# Patient Record
Sex: Male | Born: 1975 | Race: White | Hispanic: No | Marital: Married | Smoking: Current some day smoker
Health system: Southern US, Community
[De-identification: ages and names within clinical notes are randomized; demographics above are authoritative.]

## PROBLEM LIST (undated history)

## (undated) ENCOUNTER — Ambulatory Visit: Admission: EM | Payer: 59

## (undated) DIAGNOSIS — E785 Hyperlipidemia, unspecified: Secondary | ICD-10-CM

## (undated) DIAGNOSIS — Z720 Tobacco use: Secondary | ICD-10-CM

## (undated) DIAGNOSIS — E669 Obesity, unspecified: Secondary | ICD-10-CM

## (undated) DIAGNOSIS — K529 Noninfective gastroenteritis and colitis, unspecified: Secondary | ICD-10-CM

## (undated) DIAGNOSIS — D649 Anemia, unspecified: Secondary | ICD-10-CM

## (undated) DIAGNOSIS — F419 Anxiety disorder, unspecified: Secondary | ICD-10-CM

## (undated) HISTORY — DX: Hyperlipidemia, unspecified: E78.5

## (undated) HISTORY — DX: Tobacco use: Z72.0

## (undated) HISTORY — DX: Noninfective gastroenteritis and colitis, unspecified: K52.9

## (undated) HISTORY — DX: Anemia, unspecified: D64.9

## (undated) HISTORY — DX: Obesity, unspecified: E66.9

## (undated) HISTORY — PX: TONSILLECTOMY: SHX5217

## (undated) HISTORY — DX: Anxiety disorder, unspecified: F41.9

---

## 2007-02-06 ENCOUNTER — Ambulatory Visit: Payer: Self-pay | Admitting: Gastroenterology

## 2007-08-16 DIAGNOSIS — R197 Diarrhea, unspecified: Secondary | ICD-10-CM

## 2007-08-16 DIAGNOSIS — F17200 Nicotine dependence, unspecified, uncomplicated: Secondary | ICD-10-CM

## 2008-05-05 ENCOUNTER — Ambulatory Visit: Payer: Self-pay | Admitting: *Deleted

## 2008-05-05 DIAGNOSIS — F411 Generalized anxiety disorder: Secondary | ICD-10-CM | POA: Insufficient documentation

## 2008-05-05 DIAGNOSIS — E669 Obesity, unspecified: Secondary | ICD-10-CM | POA: Insufficient documentation

## 2008-05-05 DIAGNOSIS — E785 Hyperlipidemia, unspecified: Secondary | ICD-10-CM

## 2008-05-05 DIAGNOSIS — D485 Neoplasm of uncertain behavior of skin: Secondary | ICD-10-CM

## 2008-05-05 LAB — CONVERTED CEMR LAB
BUN: 17 mg/dL
Basophils Absolute: 0 10*3/uL
Basophils Relative: 0 %
CO2: 25 meq/L
Calcium: 9.5 mg/dL
Chloride: 108 meq/L
Cholesterol: 199 mg/dL
Creatinine, Ser: 0.8 mg/dL
Eosinophils Absolute: 0.1 10*3/uL
Eosinophils Relative: 1.2 %
GFR calc Af Amer: 144 mL/min
GFR calc non Af Amer: 119 mL/min
Glucose, Bld: 93 mg/dL
HCT: 49.3 %
HDL: 28.7 mg/dL — ABNORMAL LOW
Hemoglobin: 17 g/dL
LDL Cholesterol: 157 mg/dL — ABNORMAL HIGH
Lymphocytes Relative: 42.4 %
MCHC: 34.5 g/dL
MCV: 87.8 fL
Monocytes Absolute: 0.4 10*3/uL
Monocytes Relative: 6.4 %
Neutro Abs: 2.7 10*3/uL
Neutrophils Relative %: 50 %
Platelets: 225 10*3/uL
Potassium: 4.6 meq/L
RBC: 5.61 M/uL
RDW: 12 %
Sodium: 140 meq/L
TSH: 1.22 u[IU]/mL
Total CHOL/HDL Ratio: 6.9
Triglycerides: 65 mg/dL
VLDL: 13 mg/dL
WBC: 5.6 10*3/uL

## 2008-06-05 ENCOUNTER — Ambulatory Visit: Payer: Self-pay | Admitting: *Deleted

## 2008-07-08 ENCOUNTER — Ambulatory Visit: Payer: Self-pay | Admitting: *Deleted

## 2008-07-08 DIAGNOSIS — D509 Iron deficiency anemia, unspecified: Secondary | ICD-10-CM

## 2008-12-19 ENCOUNTER — Telehealth: Payer: Self-pay | Admitting: Family Medicine

## 2009-01-15 ENCOUNTER — Ambulatory Visit: Payer: Self-pay | Admitting: Internal Medicine

## 2009-01-19 ENCOUNTER — Ambulatory Visit: Payer: Self-pay | Admitting: Internal Medicine

## 2009-01-19 LAB — CONVERTED CEMR LAB
ALT: 32 units/L (ref 0–53)
AST: 22 units/L (ref 0–37)
Alkaline Phosphatase: 97 units/L (ref 39–117)
Cholesterol: 196 mg/dL (ref 0–200)
Total Bilirubin: 0.5 mg/dL (ref 0.3–1.2)
Total Protein: 6.6 g/dL (ref 6.0–8.3)

## 2009-01-30 ENCOUNTER — Ambulatory Visit (HOSPITAL_BASED_OUTPATIENT_CLINIC_OR_DEPARTMENT_OTHER): Admission: RE | Admit: 2009-01-30 | Discharge: 2009-01-30 | Payer: Self-pay | Admitting: Internal Medicine

## 2009-01-30 ENCOUNTER — Ambulatory Visit: Payer: Self-pay | Admitting: Diagnostic Radiology

## 2009-01-30 ENCOUNTER — Ambulatory Visit: Payer: Self-pay | Admitting: Internal Medicine

## 2009-01-30 ENCOUNTER — Telehealth: Payer: Self-pay | Admitting: Internal Medicine

## 2009-01-30 DIAGNOSIS — R109 Unspecified abdominal pain: Secondary | ICD-10-CM | POA: Insufficient documentation

## 2009-01-30 LAB — CONVERTED CEMR LAB
Basophils Relative: 1 % (ref 0–1)
CO2: 24 meq/L (ref 19–32)
Calcium: 9.7 mg/dL (ref 8.4–10.5)
Creatinine, Ser: 0.87 mg/dL (ref 0.40–1.50)
Eosinophils Absolute: 0.1 10*3/uL (ref 0.0–0.7)
Eosinophils Relative: 1 % (ref 0–5)
Glucose, Bld: 105 mg/dL — ABNORMAL HIGH (ref 70–99)
Lymphocytes Relative: 35 % (ref 12–46)
Lymphs Abs: 2.9 10*3/uL (ref 0.7–4.0)
Monocytes Absolute: 0.9 10*3/uL (ref 0.1–1.0)
Neutrophils Relative %: 53 % (ref 43–77)
Platelets: 247 10*3/uL (ref 150–400)
RDW: 12.5 % (ref 11.5–15.5)
WBC: 8.3 10*3/uL (ref 4.0–10.5)

## 2009-02-24 ENCOUNTER — Telehealth: Payer: Self-pay | Admitting: Internal Medicine

## 2009-03-31 ENCOUNTER — Ambulatory Visit: Payer: Self-pay | Admitting: Internal Medicine

## 2009-07-05 ENCOUNTER — Encounter: Payer: Self-pay | Admitting: Family Medicine

## 2009-07-05 ENCOUNTER — Ambulatory Visit: Payer: Self-pay | Admitting: Family Medicine

## 2009-07-05 DIAGNOSIS — M545 Low back pain: Secondary | ICD-10-CM

## 2010-03-11 ENCOUNTER — Encounter: Payer: Self-pay | Admitting: Internal Medicine

## 2010-03-11 ENCOUNTER — Ambulatory Visit: Payer: Self-pay | Admitting: Internal Medicine

## 2010-03-11 DIAGNOSIS — G4700 Insomnia, unspecified: Secondary | ICD-10-CM

## 2010-03-11 DIAGNOSIS — R002 Palpitations: Secondary | ICD-10-CM

## 2010-03-11 LAB — CONVERTED CEMR LAB
CO2: 24 meq/L (ref 19–32)
Chloride: 103 meq/L (ref 96–112)
Creatinine, Ser: 0.84 mg/dL (ref 0.40–1.50)
Free T4: 1.42 ng/dL (ref 0.80–1.80)
HCT: 49.3 % (ref 39.0–52.0)
Platelets: 241 10*3/uL (ref 150–400)
Potassium: 4.6 meq/L (ref 3.5–5.3)
WBC: 9.7 10*3/uL (ref 4.0–10.5)

## 2010-03-12 ENCOUNTER — Encounter: Payer: Self-pay | Admitting: Internal Medicine

## 2010-03-23 ENCOUNTER — Ambulatory Visit: Payer: Self-pay | Admitting: Internal Medicine

## 2010-05-10 ENCOUNTER — Encounter: Payer: Self-pay | Admitting: Internal Medicine

## 2010-05-10 LAB — CONVERTED CEMR LAB
ALT: 24 units/L (ref 0–53)
AST: 17 units/L (ref 0–37)
Albumin: 5.1 g/dL (ref 3.5–5.2)
Alkaline Phosphatase: 104 units/L (ref 39–117)
CRP, High Sensitivity: 2.2
Cholesterol: 230 mg/dL — ABNORMAL HIGH (ref 0–200)
HDL: 38 mg/dL — ABNORMAL LOW (ref >39)
Total CHOL/HDL Ratio: 6.1
Total Protein: 7.5 g/dL (ref 6.0–8.3)
Triglycerides: 81 mg/dL (ref <150)

## 2010-05-12 ENCOUNTER — Ambulatory Visit: Payer: Self-pay | Admitting: Internal Medicine

## 2010-06-29 NOTE — Letter (Signed)
   [facility] [address] [place], [place]  [address]  [place] Phone: [phone]      March 12, 2010   [facility] [address] [facility] [address] [place], [place] [address]  RE:  LAB RESULTS  Dear  Joseph Calderon,  The following is an interpretation of your most recent lab tests.  Please take note of any instructions provided or changes to medications that have resulted from your lab work.  ELECTROLYTES:  Good - no changes needed  KIDNEY FUNCTION TESTS:  Good - no changes needed   THYROID STUDIES:  Thyroid studies normal TSH: 2.534     CBC:  Good - no changes needed       Sincerely Yours,    Dr. Thomos Lemons  Appended Document:  Mailed.

## 2010-06-29 NOTE — Assessment & Plan Note (Signed)
Summary: LBP x 4dys rm 3   Vital Signs:  Patient Profile:   35 Years Old Male CC:      LBP- coughing x 4 dys Height:     71 inches Weight:      218 pounds O2 Sat:      100 % O2 treatment:    Room Air Temp:     97.4 degrees F oral Pulse rate:   87 / minute Pulse rhythm:   regular Resp:     16 per minute BP sitting:   139 / 85  (right arm) Cuff size:   regular  Vitals Entered By: Areta Haber CMA (July 05, 2009 1:14 PM)                  Current Allergies: No known allergies History of Present Illness Chief Complaint: LBP- coughing x 4 dys History of Present Illness: Subjective:  Patient complains of intermittent non-radiating low back pain for about 4 days, worse when sitting and bending over.  No recent trauma or change in activities.  He has had a cold for about 1.5 weeks with cough that is almost resolved.  The pain is improved with ibuprofen.  No bowel or bladder dysfunction.  No recent weight loss/gain.  No fevers, chills, and sweats.  No saddle numbness.  He had an episode of back pain when he was a teenager that lasted about 8 weeks.  Current Problems: LOW BACK PAIN, MILD (ICD-724.2) INGUINAL PAIN, LEFT (ICD-789.09) ANEMIA-IRON DEFICIENCY (ICD-280.9) ANXIETY (ICD-300.00) WELL ADULT (ICD-V70.0) OBESITY (ICD-278.00) NEOPLASM OF UNCERTAIN BEHAVIOR OF SKIN (ICD-238.2) HYPERLIPIDEMIA (ICD-272.4) DIARRHEA, CHRONIC (ICD-787.91) TOBACCO USER (ICD-305.1)   Current Meds PAROXETINE HCL 20 MG TABS (PAROXETINE HCL) Take 1 tablet by mouth once a day IMODIUM ADVANCED 2-125 MG TABS (LOPERAMIDE-SIMETHICONE) one tab by mouth once daily prn EQL FISH OIL 1000 MG CAPS (OMEGA-3 FATTY ACIDS) one cap by mouth once daily MULTIVITAMINS  TABS (MULTIPLE VITAMIN) one tab by mouth once daily ADVIL 200 MG TABS (IBUPROFEN) As directed CYCLOBENZAPRINE HCL 10 MG TABS (CYCLOBENZAPRINE HCL) One tab by mouth, once or twice daily as needed NAPROXEN 500 MG TABS (NAPROXEN) One by mouth two  times a day pc  REVIEW OF SYSTEMS Constitutional Symptoms      Denies fever, chills, night sweats, weight loss, weight gain, and fatigue.  Eyes       Denies change in vision, eye pain, eye discharge, glasses, contact lenses, and eye surgery. Ear/Nose/Throat/Mouth       Denies hearing loss/aids, change in hearing, ear pain, ear discharge, dizziness, frequent runny nose, frequent nose bleeds, sinus problems, sore throat, hoarseness, and tooth pain or bleeding.  Respiratory       Denies dry cough, productive cough, wheezing, shortness of breath, asthma, bronchitis, and emphysema/COPD.  Cardiovascular       Denies murmurs, chest pain, and tires easily with exhertion.    Gastrointestinal       Denies stomach pain, nausea/vomiting, diarrhea, constipation, blood in bowel movements, and indigestion. Genitourniary       Denies painful urination, kidney stones, and loss of urinary control. Neurological       Denies paralysis, seizures, and fainting/blackouts. Musculoskeletal       Complains of decreased range of motion.      Denies muscle pain, joint pain, joint stiffness, redness, swelling, muscle weakness, and gout.      Comments: LBP x 4 dys Skin       Denies bruising, unusual mles/lumps or sores, and  hair/skin or nail changes.  Psych       Denies mood changes, temper/anger issues, anxiety/stress, speech problems, depression, and sleep problems. Other Comments: Pt states that he feels his back pain maybe from coughing. Pt has not seen PCP for this.   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  Extraocular movement is intact.  Conjunctivae are not inflamed.  Pharynx:  Normal  Neck:  Supple.  No adenopathy is present.  No thyromegaly is present  Lungs:  Clear to auscultation.  Breath sounds are equal.  Heart:  Regular rate and rhythm without murmurs, rubs, or gallops.  Abdomen:  Nontender without masses or hepatosplenomegaly.  Bowel sounds are present.  No CVA or flank tenderness.   Back:  Full range of motion.  Can heel/toe walk and squat without difficulty.   Minimal tenderness in the right paraspinous muscles at L3 and L4.  Straight leg raising test is negative.  Sitting knee extension test is negative.  Strength and sensation in the lower extremities is normal.  Patellar and achilles reflexes are normal.  LS spine X-ray:  negative Assessment New Problems: LOW BACK PAIN, MILD (ICD-724.2)   Plan New Medications/Changes: NAPROXEN 500 MG TABS (NAPROXEN) One by mouth two times a day pc  #14 x 0, 07/05/2009, Donna Christen MD CYCLOBENZAPRINE HCL 10 MG TABS (CYCLOBENZAPRINE HCL) One tab by mouth, once or twice daily as needed  #14 x 0, 07/05/2009, Donna Christen MD  New Orders: T-Lumbar Spine Complete, 5 Views [71110TC] New Patient Level III [98119] Planning Comments:   NSAID for about a week.  Flexeril once or twice daily as needed.  Begin ice pack once or twice daily.  Begin back exercises ([employer] information and instruction patient handout given).  Urged to discontinue smoking. Follow-up with PCP if not improved 2 weeks, or if symptoms worsent.   The patient and/or caregiver has been  counseled thoroughly with regard to medications prescribed including dosage, schedule, interactions, rationale for use, and possible side effects and they verbalize understanding.  Diagnoses and expected course of recovery discussed and will return if not improved as expected or if the condition worsens. Patient and/or caregiver verbalized understanding.  Prescriptions: NAPROXEN 500 MG TABS (NAPROXEN) One by mouth two times a day pc  #14 x 0   Entered and Authorized by:   Donna Christen MD   Signed by:   Donna Christen MD on 07/05/2009   Method used:   Print then Give to Patient   RxID:   [id number] CYCLOBENZAPRINE HCL 10 MG TABS (CYCLOBENZAPRINE HCL) One tab by mouth, once or twice daily as needed  #14 x 0   Entered and Authorized by:   Donna Christen MD   Signed by:   Donna Christen MD on 07/05/2009   Method used:   Print  then Give to Patient   RxID:   [id number]

## 2010-06-29 NOTE — Assessment & Plan Note (Signed)
Summary: general questions/dt   Vital Signs:  Patient profile:   35 year old male Height:      71 inches Weight:      203 pounds BMI:     28.42 O2 Sat:      99 % on Room air Temp:     97.8 degrees F oral Pulse rate:   73 / minute Pulse rhythm:   regular Resp:     18 per minute BP sitting:   110 / 70  (left arm) Cuff size:   large  Vitals Entered By: Glendell Docker CMA (March 11, 2010 11:03 AM)  O2 Flow:  Room air CC: Trouble sleeping & heart racing Is Patient Diabetic? No Pain Assessment Patient in pain? no        Primary Care Provider:  Dondra Spry DO  CC:  Trouble sleeping & heart racing.  History of Present Illness: 35 y/o with hx of anxiety c/o chronic insomnia worse over last 2-3 months  recently had wisdom tooth pulled  no unusual stress at work and at home also co intermittent palpitations - pulse was 94.   2 x over last one month  no chest pain or shortness of breath  previously drinking red bull occ diet coke or mountain dew   Current Diet: Breakfast: bagel, or pop tart Lunch: peanut sandwich DInner: meat, vegetable and starch,  occ fast food Snacks: Beverage:water and juices    Preventive Screening-Counseling & Management  Alcohol-Tobacco     Smoking Status: current  Allergies (verified): No Known Drug Allergies  Past History:  Past Medical History: DIARRHEA, CHRONIC - intermittent obesity TOBACCO USER (ICD-305.1)   Hyperlipidemia  anxiety  Anemia-iron deficiency  Past Surgical History: Tonsillectomy       Family History: Family History Hypertension CAD - father        Social History: 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    Physical Exam  General:  alert, well-developed, and well-nourished.   Head:  normocephalic and atraumatic.   Mouth:  pharynx pink and moist.   Neck:  No deformities,  masses, or tenderness noted.no neck tenderness.   Lungs:  normal respiratory effort, normal breath sounds, and no wheezes.   Heart:  normal rate, regular rhythm, no murmur, and no gallop.   Extremities:  No lower extremity edema  Neurologic:  cranial nerves II-XII intact and gait normal.   Psych:  normally interactive, good eye contact, not anxious appearing, and not depressed appearing.     Impression & Recommendations:  Problem # 1:  PALPITATIONS (ICD-785.1) 35 y/o with intermittent palpitations.  likely benign. EKG normal  Orders: EKG w/ Interpretation (93000) T-Basic Metabolic Panel (59563-87564) T-CBC No Diff (33295-18841) T-T4, Free (66063-01601) T-TSH [phone]) T-Magnesium [phone])  Avoid caffeine, chocolate, and stimulants. Stress reduction as well as medication options discussed.   Problem # 2:  INSOMNIA (ICD-780.52) Assessment: Deteriorated I would like to avoid use of prescription sleep aids reviewed practicing good sleep hygiene try OTC melatononin  Problem # 3:  OBESITY (ICD-278.00) Assessment: Improved  Problem # 4:  HYPERLIPIDEMIA (ICD-272.4) arrange labs before next OV Labs Reviewed: SGOT: 22 (01/19/2009)   SGPT: 32 (01/19/2009)   HDL:35 (01/19/2009), 28.7 (05/05/2008)  LDL:142 (01/19/2009), 157 (05/05/2008)  Chol:196 (01/19/2009), 199 (05/05/2008)  Trig:97 (01/19/2009), 65 (05/05/2008)  Complete Medication List: 1)  Multivitamins Tabs (Multiple vitamin) .... One tab  by mouth once daily 2)  Advil 200 Mg Tabs (Ibuprofen) .... As directed 3)  Hydrocodone-acetaminophen 5-500 Mg Tabs (Hydrocodone-acetaminophen) .... One tablet every 4-6 hours as needed pain  Other Orders: Flu Vaccine 6yrs + (14782) Admin 1st Vaccine (95621)  Patient Instructions: 1)  Practice good sleep hygiene as discussed 2)  Try over the counter melatonin 3)  Please schedule a follow-up appointment in 2 months. 4)  Decrease intake of sweets / refined sugar 5)  Avoid  caffeine after 12 PM 6)  Hepatic Panel prior to visit, ICD-9: 272.4 7)  Lipid Panel prior to visit, ICD-9: 272.4 8)  High sensitivity CRP :  272.4  Current Allergies (reviewed today): No known allergies    Immunizations Administered:  Influenza Vaccine # 1:    Vaccine Type: Fluvax 3+    Site: left deltoid    Mfr: GlaxoSmithKline    Dose: 0.5 ml    Route: IM    Given by: Glendell Docker CMA    Exp. Date: 11/27/2010    Lot #: [id number]    VIS given: 12/22/09 version given March 11, 2010.  Flu Vaccine Consent Questions:    Do you have a history of severe allergic reactions to this vaccine? no    Any prior history of allergic reactions to egg and/or gelatin? no    Do you have a sensitivity to the preservative Thimersol? no    Do you have a past history of Guillan-Barre Syndrome? no    Do you currently have an acute febrile illness? no    Have you ever had a severe reaction to latex? no    Vaccine information given and explained to patient? yes

## 2010-07-01 NOTE — Miscellaneous (Signed)
Summary: Orders Update  Clinical Lists Changes  Orders: Added new Test order of T-Hepatic Function [phone]) - Signed Added new Test order of T-Lipid Profile [phone]) - Signed Added new Test order of CRP, high sensitivity-[facility] ([id number]) - Signed

## 2010-07-01 NOTE — Assessment & Plan Note (Signed)
Summary: 2 month follow up/mhf   Vital Signs:  Patient profile:   35 year old male Height:      71 inches Weight:      211 pounds BMI:     29.53 O2 Sat:      99 % on Room air Temp:     97.9 degrees F oral Pulse rate:   77 / minute Resp:     20 per minute BP sitting:   102 / 80  (right arm) Cuff size:   large  Vitals Entered By: Glendell Docker CMA (May 12, 2010 8:13 AM)  O2 Flow:  Room air CC: 2 Month Follow up  Is Patient Diabetic? No Pain Assessment Patient in pain? no        Primary Care Provider:  Dondra Spry DO  CC:  2 Month Follow up .  History of Present Illness:  Hyperlipidemia Follow-Up      This is a 35 year old man who presents for Hyperlipidemia follow-up.  The patient denies muscle aches.  The patient denies the following symptoms: chest pain/pressure and exercise intolerance.  Dietary compliance has been fair.  LDL elevated despite dietary changes  Preventive Screening-Counseling & Management  Alcohol-Tobacco     Smoking Status: current  Allergies (verified): No Known Drug Allergies  Past History:  Past Medical History: DIARRHEA, CHRONIC - intermittent obesity TOBACCO USER (ICD-305.1)   Hyperlipidemia  anxiety  Anemia-iron deficiency   Family History: Family History Hypertension CAD - father    (MI at age 77)  CABG   Physical Exam  General:  alert, well-developed, and well-nourished.   Neck:  no carotid bruits.   Lungs:  normal respiratory effort, normal breath sounds, and no wheezes.   Heart:  normal rate, regular rhythm, and no gallop.   Extremities:  No lower extremity edema    Impression & Recommendations:  Problem # 1:  HYPERLIPIDEMIA (ICD-272.4) strong fam hx of CAD start statin His updated medication list for this problem includes:    Lipitor 20 Mg Tabs (Atorvastatin calcium) ..... One by mouth once daily  Labs Reviewed: SGOT: 17 (05/10/2010)   SGPT: 24 (05/10/2010)   HDL:38 (05/10/2010), 35 (01/19/2009)   LDL:176 (05/10/2010), 142 (01/19/2009)  Chol:230 (05/10/2010), 196 (01/19/2009)  Trig:81 (05/10/2010), 97 (01/19/2009)  Complete Medication List: 1)  Multivitamins Tabs (Multiple vitamin) .... One tab by mouth once daily 2)  Lipitor 20 Mg Tabs (Atorvastatin calcium) .... One by mouth once daily  Patient Instructions: 1)  Please schedule a follow-up appointment in 4 months. 2)  Hepatic Panel prior to visit, ICD-9:  272.4 3)  Lipid Panel prior to visit, ICD-9: 272.4 4)  High sensitivity CRP :  272.4 5)  Please return for lab work one (1) week before your next appointment.  Prescriptions: LIPITOR 20 MG TABS (ATORVASTATIN CALCIUM) one by mouth once daily  #30 x 5   Entered and Authorized by:   D. Thomos Lemons DO   Signed by:   D. Thomos Lemons DO on 05/12/2010   Method used:   Electronically to        [facility] # [phone]* (retail)       [address] [place], [place]  [address]       Ph: [phone]       Fax: [phone]   RxID:   [id number]    Orders Added: 1)  Est. Patient  Level III [16109]    Current Allergies (reviewed today): No known allergies

## 2010-08-31 ENCOUNTER — Ambulatory Visit: Payer: Self-pay | Admitting: Internal Medicine

## 2010-09-23 ENCOUNTER — Other Ambulatory Visit: Payer: Self-pay | Admitting: Internal Medicine

## 2010-09-23 DIAGNOSIS — E785 Hyperlipidemia, unspecified: Secondary | ICD-10-CM

## 2010-09-23 LAB — LIPID PANEL
HDL: 37 mg/dL — ABNORMAL LOW (ref >39)
Triglycerides: 89 mg/dL (ref <150)

## 2010-09-24 LAB — HEPATIC FUNCTION PANEL
ALT: 33 U/L (ref 0–53)
Albumin: 5 g/dL (ref 3.5–5.2)
Total Protein: 6.9 g/dL (ref 6.0–8.3)

## 2010-09-27 ENCOUNTER — Ambulatory Visit (INDEPENDENT_AMBULATORY_CARE_PROVIDER_SITE_OTHER): Payer: BC Managed Care – PPO | Admitting: Internal Medicine

## 2010-09-27 ENCOUNTER — Encounter: Payer: Self-pay | Admitting: Internal Medicine

## 2010-09-27 DIAGNOSIS — J329 Chronic sinusitis, unspecified: Secondary | ICD-10-CM

## 2010-09-27 DIAGNOSIS — R748 Abnormal levels of other serum enzymes: Secondary | ICD-10-CM | POA: Insufficient documentation

## 2010-09-27 MED ORDER — AMOXICILLIN 875 MG PO TABS: 875.0000 mg | ORAL_TABLET | Freq: Two times a day (BID) | ORAL | Status: AC

## 2010-09-27 NOTE — Patient Instructions (Addendum)
Our office will contact you re:  Liver blood test results Please complete the following lab tests before your next follow up appointment: LFT's - 790.5 CBCD - 790.5

## 2010-09-27 NOTE — Progress Notes (Signed)
  Subjective:    Patient ID: Joseph Calderon, male    DOB: March 16, 1976, 35 y.o.   MRN: [id number]  Sinusitis This is a new problem. The current episode started in the past 7 days. The problem is unchanged. There has been no fever. Associated symptoms include congestion, headaches and sinus pressure.   Hyperlipidemia - lab results reviewed.  Pt has elevated alk phos.  He denies abd complaints.   Review of Systems  HENT: Positive for congestion and sinus pressure.   Neurological: Positive for headaches.    He quit smoking 3 weeks ago.  Rare loose stools,  No abd pain     Past Medical History  Diagnosis Date  . Anxiety   . Anemia     iron deficient  . Chronic diarrhea   . Obesity   . Tobacco user   . Hyperlipemia     History   Social History  . Marital Status: Married    Spouse Name: N/A    Number of Children: 1  . Years of Education: N/A   Occupational History  . ASSISTANT STORE MANA    Social History Main Topics  . Smoking status: Former Games developer  . Smokeless tobacco: Not on file  . Alcohol Use: Not on file  . Drug Use: Not on file  . Sexually Active: Not on file   Other Topics Concern  . Not on file   Social History Narrative  . No narrative on file    Past Surgical History  Procedure Date  . Tonsillectomy     Family History  Problem Relation Age of Onset  . Heart disease Father 7     MI, CAD, CABG    No Known Allergies  Current Outpatient Prescriptions on File Prior to Visit  Medication Sig Dispense Refill  . atorvastatin (LIPITOR) 20 MG tablet Take 20 mg by mouth daily.        . Multiple Vitamins-Minerals (MULTIVITAMIN WITH MINERALS) tablet Take 1 tablet by mouth daily.          BP 112/68  Pulse 72  Temp(Src) 97.4 F (36.3 C) (Oral)  Resp 18  Ht 5\' 11"  (1.803 m)  Wt 220 lb (99.791 kg)  BMI 30.68 kg/m2  SpO2 98%    Objective:   Physical Exam  Constitutional: He appears well-developed.  HENT:  Head: Normocephalic and atraumatic.    Mouth/Throat: Oropharynx is clear and moist.       Right and left TM retracted  Neck: Normal range of motion. Neck supple.  Cardiovascular: Normal rate, regular rhythm and normal heart sounds.   Pulmonary/Chest: Effort normal and breath sounds normal. He has no wheezes. He has no rales.  Skin: Skin is warm and dry.    Assessment & Plan:

## 2010-09-28 ENCOUNTER — Telehealth: Payer: Self-pay | Admitting: Internal Medicine

## 2010-09-28 LAB — VITAMIN D 25 HYDROXY (VIT D DEFICIENCY, FRACTURES): Vit D, 25-Hydroxy: 43 ng/mL (ref 30–89)

## 2010-09-28 NOTE — Telephone Encounter (Signed)
Please change to whole body bone scan. Give verbal order if possible otherwise, try to find new order

## 2010-09-28 NOTE — Telephone Encounter (Signed)
Received call from Sheralyn Boatman at [facility] needing clarification on "bone marrow scan whole body" order that was placed today. They do not do these type of test. They can do a "whole body scan". Sheralyn Boatman is requesting clarification of order and new order to be placed in the system for the correct test that is needed. Also, she wanted [place] to know that when you place a radiology order you cannot change the order class as this will mess the order up. No need to change order class to external. Must use what it defaults to. Please advise.  Tf,CMA

## 2010-09-28 NOTE — Telephone Encounter (Signed)
Call placed to patient at [phone], male individual stated patient was not available. Message was left for patient to return phone call

## 2010-09-28 NOTE — Telephone Encounter (Signed)
Call pt - GGT blood test is normal.   Elevated in alk phos may be due bone abnormality.  I suggest bone scan.  See orders

## 2010-09-29 NOTE — Telephone Encounter (Signed)
Call returned to [facility] radiology, she was advised that Dr Artist Pais would like the patient to have a whole body scan. She states that she will change the order and Dr Artist Pais would receive a message to sign off on the change in order.

## 2010-10-01 NOTE — Telephone Encounter (Signed)
Call placed to patient at [phone], no answer. A detailed voice message was left for patient to return phone call regarding lab results

## 2010-10-04 NOTE — Telephone Encounter (Signed)
No return call from patient.

## 2010-10-05 ENCOUNTER — Telehealth: Payer: Self-pay | Admitting: Internal Medicine

## 2010-10-05 NOTE — Telephone Encounter (Signed)
Pt states that he is returning Darlene's phone call from last Friday 10-01-10. Pt states phone call was regarding test results.

## 2010-10-06 NOTE — Telephone Encounter (Signed)
Bone scan is rule out musculoskeletal etiology in elevated alkaline phosphatase.   If pt does not want to have scan, I suggest he at least return in 3 months for repeat LFTs

## 2010-10-06 NOTE — Telephone Encounter (Signed)
Call returned to patient at [phone], he was advised per Dr Artist Pais instructions. He stated that he will wait and follow up with Dr Artist Pais. He has decided not to proceed with the bone scan at this time

## 2010-10-06 NOTE — Telephone Encounter (Signed)
Call returned to patient at [phone]. He was advised of his his GGT blood work and abnormal lab. Patient would like to know if the bone scan is a precautionary measure,and it is required that he has it. He would also like to know if the elevation in his Alk Phos, is something that he should be concerned about

## 2010-10-08 ENCOUNTER — Encounter (HOSPITAL_COMMUNITY): Payer: BC Managed Care – PPO

## 2010-10-08 ENCOUNTER — Ambulatory Visit (HOSPITAL_COMMUNITY): Payer: BC Managed Care – PPO

## 2010-10-12 NOTE — Assessment & Plan Note (Signed)
[facility]                         GASTROENTEROLOGY OFFICE NOTE   NAME:ECCARDTayron, Hunnell                        MRN:          [id number]  DATE:02/06/2007                            DOB:          06-Dec-1975    REASON FOR REFERRAL:  Dr. Collins Scotland asked me to evaluate Mr. Maese in  consultation regarding chronic diarrhea, chronic abdominal discomfort.   HISTORY OF PRESENT ILLNESS:  Mr. Leeman is a very pleasant 35 year old  man who has had at least 10 years of loose stools.  He describes at  least 3-4 times a week he will have very loose stools that will usually  get him up in the middle of the night.  He will have cramping in his  abdomen that will awaken him, and this will be followed by loose stools  that will almost always relieve the cramping.  He will often move his  bowels 3-4 times during an episode.  He has never seen blood in his  stool.  He did have this evaluated when it started approximately 8-10  years ago by a gastroenterologist at [facility].  He did not have  colonoscopy or upper endoscopy it sounds like.  He did have some type of  radiology testing and he was told that he had irritable bowel syndrome.  He was put on a medicine that did not seem to help and he has not really  followed up with a doctor since then.   REVIEW OF SYSTEMS:  Notable for stable weight, it is otherwise  essentially normal and is available on his nursing intake sheet.   PAST MEDICAL HISTORY:  None.   CURRENT MEDICATIONS:  None.   ALLERGIES:  No known drug allergies.   SOCIAL HISTORY:  Married with one 67-month-old daughter.  He smokes a  pack of cigarettes a day.  He drinks one beer every 2 weeks.  He drinks  2-3 caffeinated beverages a day.   FAMILY HISTORY:  No colon cancer, colon polyps in the family.  No  colitis in the family.   PHYSICAL EXAMINATION:  VITAL SIGNS:  Five feet 10 inches, 216 pounds,  blood pressure 120/74, pulse 88.  CONSTITUTIONAL:   Generally well-appearing.  NEUROLOGIC:  Alert and oriented x3.  EYES:  Extraocular movements intact.  MOUTH:  Oropharynx moist.  No lesions.  NECK:  Supple.  No lymphadenopathy.  CARDIOVASCULAR:  Heart regular rate and rhythm.  LUNGS:  Clear to auscultation bilaterally.  ABDOMEN:  Soft, nontender, nondistended.  Normal bowel sounds.  EXTREMITIES:  No lower extremity edema.  SKIN:  No rash or lesions on his visible extremities.   ASSESSMENT:  A 35 year old man with chronic intermittent diarrhea for 10  years.   PLAN:  I did not mention above that he did have recent lab tests showing  a normal CBC, a normal complete metabolic profile, and a normal thyroid  testing.  I will add to that a blood test for celiac sprue (TTG).  He  may have inflammatory bowel disease, this might be IBS diarrhea-  predominant.  I doubt that he has had an infection  for 10 years.  I  think most important would be to perform a full colonoscopy at his  soonest convenience.  He has agreed to that.     Rachael Fee, MD  Electronically Signed    DPJ/MedQ  DD: 02/06/2007  DT: 02/06/2007  Job #: [id number]   cc:   Tammy R. Collins Scotland, M.D.

## 2010-10-27 DIAGNOSIS — J329 Chronic sinusitis, unspecified: Secondary | ICD-10-CM | POA: Insufficient documentation

## 2010-10-27 NOTE — Assessment & Plan Note (Signed)
Pt advised to take full course of abx Patient advised to call office if symptoms persist or worsen.

## 2010-10-27 NOTE — Assessment & Plan Note (Signed)
Pt with isolated elevation in Alk phos.  No abdominal complaints.  GGT is negative.   Obtain bone scan

## 2010-11-29 ENCOUNTER — Ambulatory Visit: Payer: BC Managed Care – PPO | Admitting: Internal Medicine

## 2011-01-25 ENCOUNTER — Encounter: Payer: Self-pay | Admitting: Family Medicine

## 2011-01-25 ENCOUNTER — Inpatient Hospital Stay (INDEPENDENT_AMBULATORY_CARE_PROVIDER_SITE_OTHER)
Admission: RE | Admit: 2011-01-25 | Discharge: 2011-01-25 | Disposition: A | Discharge status: Home / Self Care | Payer: BC Managed Care – PPO | Source: Ambulatory Visit | Attending: Family Medicine | Admitting: Family Medicine

## 2011-01-25 DIAGNOSIS — R21 Rash and other nonspecific skin eruption: Secondary | ICD-10-CM | POA: Insufficient documentation

## 2011-01-25 DIAGNOSIS — L03221 Cellulitis of neck: Secondary | ICD-10-CM

## 2011-01-25 DIAGNOSIS — L919 Hypertrophic disorder of the skin, unspecified: Secondary | ICD-10-CM | POA: Insufficient documentation

## 2011-01-25 DIAGNOSIS — L909 Atrophic disorder of skin, unspecified: Secondary | ICD-10-CM | POA: Insufficient documentation

## 2011-01-25 DIAGNOSIS — L0211 Cutaneous abscess of neck: Secondary | ICD-10-CM

## 2011-05-02 NOTE — Progress Notes (Signed)
Summary: SPOT ON NECK...WSE   Vital Signs:  Patient Profile:   35 Years Old Male CC:      red spot on neck x 1 day Height:     71 inches Weight:      220.50 pounds O2 Sat:      98 % O2 treatment:    Room Air Temp:     98.5 degrees F oral Pulse rate:   75 / minute Resp:     20 per minute BP sitting:   137 / 79  (left arm) Cuff size:   regular  Vitals Entered By: Clemens Catholic LPN (January 25, 2011 8:03 PM)                  Prior Medication List:  MULTIVITAMINS  TABS (MULTIPLE VITAMIN) one tab by mouth once daily LIPITOR 20 MG TABS (ATORVASTATIN CALCIUM) one by mouth once daily   Updated Prior Medication List: MULTIVITAMINS  TABS (MULTIPLE VITAMIN) one tab by mouth once daily LIPITOR 20 MG TABS (ATORVASTATIN CALCIUM) one by mouth once daily CHANTIX 1 MG TABS (VARENICLINE TARTRATE)   Current Allergies (reviewed today): No known allergies History of Present Illness Chief Complaint: red spot on neck x 1 day History of Present Illness: Patient reports rash on the R side of his neck . Started today w/redness athhe base of his neck. He states it is startintint to itch as well.  Current Problems: RASH AND OTHER NONSPECIFIC SKIN ERUPTION (ICD-782.1) SKIN TAG (ICD-701.9) CELLULITIS, NECK (ICD-682.1) INSOMNIA (ICD-780.52) PALPITATIONS (ICD-785.1) LOW BACK PAIN, MILD (ICD-724.2) INGUINAL PAIN, LEFT (ICD-789.09) ANEMIA-IRON DEFICIENCY (ICD-280.9) ANXIETY (ICD-300.00) WELL ADULT (ICD-V70.0) OBESITY (ICD-278.00) NEOPLASM OF UNCERTAIN BEHAVIOR OF SKIN (ICD-238.2) HYPERLIPIDEMIA (ICD-272.4) DIARRHEA, CHRONIC (ICD-787.91) TOBACCO USER (ICD-305.1)   Current Meds MULTIVITAMINS  TABS (MULTIPLE VITAMIN) one tab by mouth once daily LIPITOR 20 MG TABS (ATORVASTATIN CALCIUM) one by mouth once daily CHANTIX 1 MG TABS (VARENICLINE TARTRATE)  DOXYCYCLINE HYCLATE 100 MG CAPS (DOXYCYCLINE HYCLATE) Take 1 tab twice a day BACTROBAN 2 % OINT (MUPIROCIN) apply 3 x aday for 10  days  REVIEW OF SYSTEMS Constitutional Symptoms      Denies fever, chills, night sweats, weight loss, weight gain, and fatigue.  Eyes       Denies change in vision, eye pain, eye discharge, glasses, contact lenses, and eye surgery. Ear/Nose/Throat/Mouth       Denies hearing loss/aids, change in hearing, ear pain, ear discharge, dizziness, frequent runny nose, frequent nose bleeds, sinus problems, sore throat, hoarseness, and tooth pain or bleeding.  Respiratory       Denies dry cough, productive cough, wheezing, shortness of breath, asthma, bronchitis, and emphysema/COPD.  Cardiovascular       Denies murmurs, chest pain, and tires easily with exhertion.    Gastrointestinal       Denies stomach pain, nausea/vomiting, diarrhea, constipation, blood in bowel movements, and indigestion. Genitourniary       Denies painful urination, kidney stones, and loss of urinary control. Neurological       Denies paralysis, seizures, and fainting/blackouts. Musculoskeletal       Complains of redness and swelling.      Denies muscle pain, joint pain, joint stiffness, decreased range of motion, muscle weakness, and gout.  Skin       Denies bruising, unusual mles/lumps or sores, and hair/skin or nail changes.  Psych       Denies mood changes, temper/anger issues, anxiety/stress, speech problems, depression, and sleep problems. Other Comments: pt c/o of a  red area on the RT side of his neck x 1 day. he used OTC anti itch med with no relief.   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 DIARRHEA, CHRONIC - intermittent obesity TOBACCO USER (ICD-305.1)   Hyperlipidemia  anxiety  Anemia-iron deficiency   Past Surgical History: Reviewed history from 03/11/2010 and no changes required. Tonsillectomy       Family History: Reviewed history from 05/12/2010 and no changes required. Family History Hypertension CAD - father    (MI at age 87)  CABG   Social History: Reviewed history from 03/11/2010 and no changes required. 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      Current Smoker-trying to quit/ currently 1 cig per day Alcohol use-no Drug use-no Physical Exam General appearance: well developed, well nourished, no acute distress Head: normocephalic, atraumatic Ears: normal, no lesions or deformities Neck: multtiple smal skin tags/moles w/one partially removed Skin: no obvious rashes or lesions MSE: oriented to time, place, and person Assessment Problems:   INSOMNIA (ICD-780.52) PALPITATIONS (ICD-785.1) LOW BACK PAIN, MILD (ICD-724.2) INGUINAL PAIN, LEFT (ICD-789.09) ANEMIA-IRON DEFICIENCY (ICD-280.9) ANXIETY (ICD-300.00) WELL ADULT (ICD-V70.0) OBESITY (ICD-278.00) NEOPLASM OF UNCERTAIN BEHAVIOR OF SKIN (ICD-238.2) HYPERLIPIDEMIA (ICD-272.4) DIARRHEA, CHRONIC (ICD-787.91) TOBACCO USER (ICD-305.1) New Problems: RASH AND OTHER NONSPECIFIC SKIN ERUPTION (ICD-782.1) SKIN TAG (ICD-701.9) CELLULITIS, NECK (ICD-682.1)   Patient Education: Patient and/or caregiver instructed in the following: rest fluids and Tylenol.  Plan New Medications/Changes: BACTROBAN 2 % OINT (MUPIROCIN) apply 3 x aday for 10 days  #1 tube x 1, 01/25/2011, Hassan Rowan MD DOXYCYCLINE HYCLATE 100 MG CAPS (DOXYCYCLINE HYCLATE) Take 1 tab twice a day  #20 x 0, 01/25/2011, Hassan Rowan MD  New Orders: Est. Patient Level III  [phone] Planning Comments:   need to schedule a visit to PCP for skin tag removal  Follow Up: Follow up on an as needed basis, Follow up with Primary Physician Follow Up: 3-10 days if not improving  The patient and/or caregiver has been counseled thoroughly with regard to medications prescribed including dosage, schedule, interactions, rationale for use, and possible side effects and they verbalize understanding.  Diagnoses and expected course of recovery discussed and will return if not improved as expected or if the condition worsens. Patient and/or caregiver verbalized understanding.  Prescriptions: BACTROBAN 2 % OINT (MUPIROCIN) apply 3 x aday for 10 days  #1 tube x 1   Entered and Authorized by:   Hassan Rowan MD   Signed by:   Hassan Rowan MD on 01/25/2011   Method used:   Printed then faxed to ...       [facility] [address] # [phone]* (retail)       [address] [place], [place]  [address]       Ph: [phone]       Fax: [phone]   RxID:   [id number] DOXYCYCLINE HYCLATE 100 MG CAPS (DOXYCYCLINE  HYCLATE) Take 1 tab twice a day  #20 x 0   Entered and Authorized by:   Hassan Rowan MD   Signed by:   Hassan Rowan MD on 01/25/2011   Method used:   Printed then faxed to ...       [facility] [address] # [phone]* (retail)       [address] [place], [place]  [address]       Ph: [phone]       Fax: [phone]   RxID:   [id number]   Patient Instructions: 1)  Need to have skin tags on neck removed under conteolled condition the near future.  2)  Please schedule a follow-up appointment as needed. 3)  Please schedule an appointment with your primary doctor in :5-14 days if needed 4)  Take your antibiotic as prescribed until ALL of it is gone, but stop if you develop a rash or swelling and contact our office as soon as possible.  Orders Added: 1)  Est. Patient Level III [44010]

## 2011-05-09 ENCOUNTER — Encounter: Payer: Self-pay | Admitting: *Deleted

## 2011-05-09 ENCOUNTER — Emergency Department (INDEPENDENT_AMBULATORY_CARE_PROVIDER_SITE_OTHER)
Admission: EM | Admit: 2011-05-09 | Discharge: 2011-05-09 | Disposition: A | Discharge status: Home / Self Care | Payer: BC Managed Care – PPO | Source: Home / Self Care | Attending: Emergency Medicine | Admitting: Emergency Medicine

## 2011-05-09 ENCOUNTER — Emergency Department
Admit: 2011-05-09 | Discharge: 2011-05-09 | Disposition: A | Discharge status: Home / Self Care | Payer: BC Managed Care – PPO

## 2011-05-09 DIAGNOSIS — M25539 Pain in unspecified wrist: Secondary | ICD-10-CM

## 2011-05-09 DIAGNOSIS — M25532 Pain in left wrist: Secondary | ICD-10-CM

## 2011-05-09 NOTE — ED Notes (Signed)
Pt c/o LT wrist pain x 3 wks, no injury.

## 2011-05-09 NOTE — ED Provider Notes (Signed)
History     CSN: [id number] Arrival date & time: 05/09/2011 11:45 AM   First MD Initiated Contact with Patient 05/09/11 1146      Chief Complaint  Patient presents with  . Wrist Pain    (Consider location/radiation/quality/duration/timing/severity/associated sxs/prior treatment) HPI This is a right-handed male who comes in today with about 2 weeks of left wrist pain. He does not recall any type of trauma, or accidents. He does have a 35-year-old child and frequently lifts them. Since he did not recall any reasons for his pain, he thought that the pain would be going away on its own. However it has not so he is here to have it checked on. No previous injuries. He reports the pain is located on the ulnar aspect of his wrist.  Past Medical History  Diagnosis Date  . Anxiety   . Anemia     iron deficient  . Chronic diarrhea   . Obesity   . Tobacco user   . Hyperlipemia     Past Surgical History  Procedure Date  . Tonsillectomy     Family History  Problem Relation Age of Onset  . Heart disease Father 18     MI, CAD, CABG    History  Substance Use Topics  . Smoking status: Former Games developer  . Smokeless tobacco: Not on file  . Alcohol Use: Not on file      Review of Systems  Allergies  Review of patient's allergies indicates no known allergies.  Home Medications   Current Outpatient Rx  Name Route Sig Dispense Refill  . ATORVASTATIN CALCIUM 20 MG PO TABS Oral Take 20 mg by mouth daily.      . MULTI-VITAMIN/MINERALS PO TABS Oral Take 1 tablet by mouth daily.        There were no vitals taken for this visit.  Physical Exam  Nursing note and vitals reviewed. Constitutional: He is oriented to person, place, and time. He appears well-developed and well-nourished.  HENT:  Head: Normocephalic and atraumatic.  Neck: Neck supple.  Cardiovascular: Regular rhythm and normal heart sounds.   Pulmonary/Chest: Effort normal and breath sounds normal. No respiratory  distress.  Musculoskeletal:       Left wrist: He exhibits tenderness. He exhibits normal range of motion, no swelling, no effusion and no crepitus.       Left wrist examination demonstrates full range of motion flexion extension radial and ulnar deviation. Resisted and forced ulnar deviation does reproduce his pain. PNMT test is negative. There is no snuffbox tenderness or any other radial sided tenderness. There is slight tenderness to palpation on the distal ulna and the ulna carpal joint.  There is slight tenderness to palpate at Guyon's canal but no apparent masses.  The distal neurovascular status is intact.  Neurological: He is alert and oriented to person, place, and time.  Skin: Skin is warm and dry.  Psychiatric: He has a normal mood and affect. His speech is normal.    ED Course  Procedures (including critical care time)  Labs Reviewed - No data to display No results found.   1. Left wrist pain       MDM  An X-ray was ordered and read by the radiologist as above. Encourage rest, ice, compression with ACE bandage and/or a brace, and elevation of injured body part.  We will place him in a wrist splint for the next 7-10 days. I have also advised him he needs to be icing this wrist frequently  throughout the day and avoiding any heavy lifting, pushing or pulling. If he is not improved by then, would like him to be seen by sports medicine and/or consider occupational therapy.   Lily Kocher, MD 05/09/11 [phone]

## 2012-05-13 ENCOUNTER — Emergency Department (INDEPENDENT_AMBULATORY_CARE_PROVIDER_SITE_OTHER): Payer: BC Managed Care – PPO

## 2012-05-13 ENCOUNTER — Emergency Department (INDEPENDENT_AMBULATORY_CARE_PROVIDER_SITE_OTHER)
Admission: EM | Admit: 2012-05-13 | Discharge: 2012-05-13 | Disposition: A | Discharge status: Home / Self Care | Payer: BC Managed Care – PPO | Source: Home / Self Care | Attending: Emergency Medicine | Admitting: Emergency Medicine

## 2012-05-13 ENCOUNTER — Encounter: Payer: Self-pay | Admitting: *Deleted

## 2012-05-13 DIAGNOSIS — S9030XA Contusion of unspecified foot, initial encounter: Secondary | ICD-10-CM

## 2012-05-13 DIAGNOSIS — M79609 Pain in unspecified limb: Secondary | ICD-10-CM

## 2012-05-13 DIAGNOSIS — S9032XA Contusion of left foot, initial encounter: Secondary | ICD-10-CM

## 2012-05-13 DIAGNOSIS — M7989 Other specified soft tissue disorders: Secondary | ICD-10-CM

## 2012-05-13 NOTE — ED Provider Notes (Signed)
History     CSN: [id number]  Arrival date & time 05/13/12  1137   First MD Initiated Contact with Patient 05/13/12 1138      Chief Complaint  Patient presents with  . Toe Pain    left great toe    Patient is a 36 y.o. male presenting with toe pain. The history is provided by the patient.  Toe Pain This is a new problem. The current episode started 6 to 12 hours ago. The problem occurs constantly. The problem has not changed since onset.Pertinent negatives include no chest pain, no abdominal pain, no headaches and no shortness of breath. The symptoms are aggravated by bending. The symptoms are relieved by rest. He has tried nothing for the symptoms.   He recalls no injury. He awoke this morning with mild pain, swelling and discoloration left fifth toe and lateral forefoot. No drainage or wound or toenail problems. He denies fever or chills or red streaks. No history of gout or similar problem. Past Medical History  Diagnosis Date  . Anxiety   . Anemia     iron deficient  . Chronic diarrhea   . Obesity   . Tobacco user   . Hyperlipemia     Past Surgical History  Procedure Date  . Tonsillectomy     Family History  Problem Relation Age of Onset  . Heart disease Father 50     MI, CAD, CABG    History  Substance Use Topics  . Smoking status: Former Games developer  . Smokeless tobacco: Not on file  . Alcohol Use: No      Review of Systems  Respiratory: Negative for shortness of breath.   Cardiovascular: Negative for chest pain.  Gastrointestinal: Negative for abdominal pain.  Neurological: Negative for headaches.  All other systems reviewed and are negative.    Allergies  Review of patient's allergies indicates no known allergies.  Home Medications   Current Outpatient Rx  Name  Route  Sig  Dispense  Refill  . ATORVASTATIN CALCIUM 20 MG PO TABS   Oral   Take 20 mg by mouth daily.           . MULTI-VITAMIN/MINERALS PO TABS   Oral   Take 1 tablet by mouth  daily.             BP 126/85  Pulse 75  Temp 97.5 F (36.4 C) (Oral)  Resp 20  Ht 5\' 11"  (1.803 m)  Wt 233 lb (105.688 kg)  BMI 32.50 kg/m2  SpO2 97%  Physical Exam  Nursing note and vitals reviewed. Constitutional: He is oriented to person, place, and time. He appears well-developed and well-nourished. No distress.  HENT:  Head: Normocephalic and atraumatic.  Eyes: Conjunctivae normal and EOM are normal. Pupils are equal, round, and reactive to light. No scleral icterus.  Neck: Normal range of motion.  Cardiovascular: Normal rate.   Pulmonary/Chest: Effort normal.  Abdominal: He exhibits no distension.  Musculoskeletal: Normal range of motion.       Left foot: He exhibits tenderness (to deep palpation over left fifth toe and distal fourth and fifth metatarsals of left foot.) and swelling. He exhibits normal capillary refill.       Toenails within normal limits. No heat. The left fifth toe dark red coloration, normal capillary refill . Neurovascular intact. No other foot abnormalities noted.  Neurological: He is alert and oriented to person, place, and time.  Skin: Skin is warm.  Psychiatric: He has a normal  mood and affect.   Gait wnl. ED Course  Procedures (including critical care time) 3:51 PM Xray L foot ordered. Pt agrees.  Labs Reviewed - No data to display Dg Foot Complete Left  05/13/2012  *RADIOLOGY REPORT*  Clinical Data: Left foot pain, no known injury  LEFT FOOT - COMPLETE 3+ VIEW  Comparison: None.  Findings: No fracture  or dislocation of midfoot or forefoot.  The phalanges are normal.  The calcaneus is normal.  No soft tissue abnormality.  IMPRESSION: No acute osseous abnormality.   Original Report Authenticated By: Genevive Bi, M.D.      1. Contusion of foot, left       MDM  X-ray left foot negative. He likely has contusion of left foot and left fifth toe. Discussed symptomatic care. Encour.age rest, ice, compression with ACE bandage, and  elevation of injured body part. He declined Ace bandage or postop shoe or other modality today. He'll take OTC ibuprofen. Followup with PCP or sports medicine if no better one week, sooner if worse or new symptoms. Red flags discussed. He agrees with above.         Lajean Manes, MD 05/13/12 [phone]

## 2012-05-13 NOTE — ED Notes (Signed)
Patient c/o left great toe pain, denies drainage x 2 days ago.

## 2012-06-06 IMAGING — CR DG WRIST COMPLETE 3+V*L*
2 series · 2 of 2 positions shown · non-contrast
Comparison: None.

CLINICAL DATA: Wrist pain for 3 weeks, no injury

LEFT WRIST - COMPLETE 3+ VIEW

[view not recorded (1 of 2)]
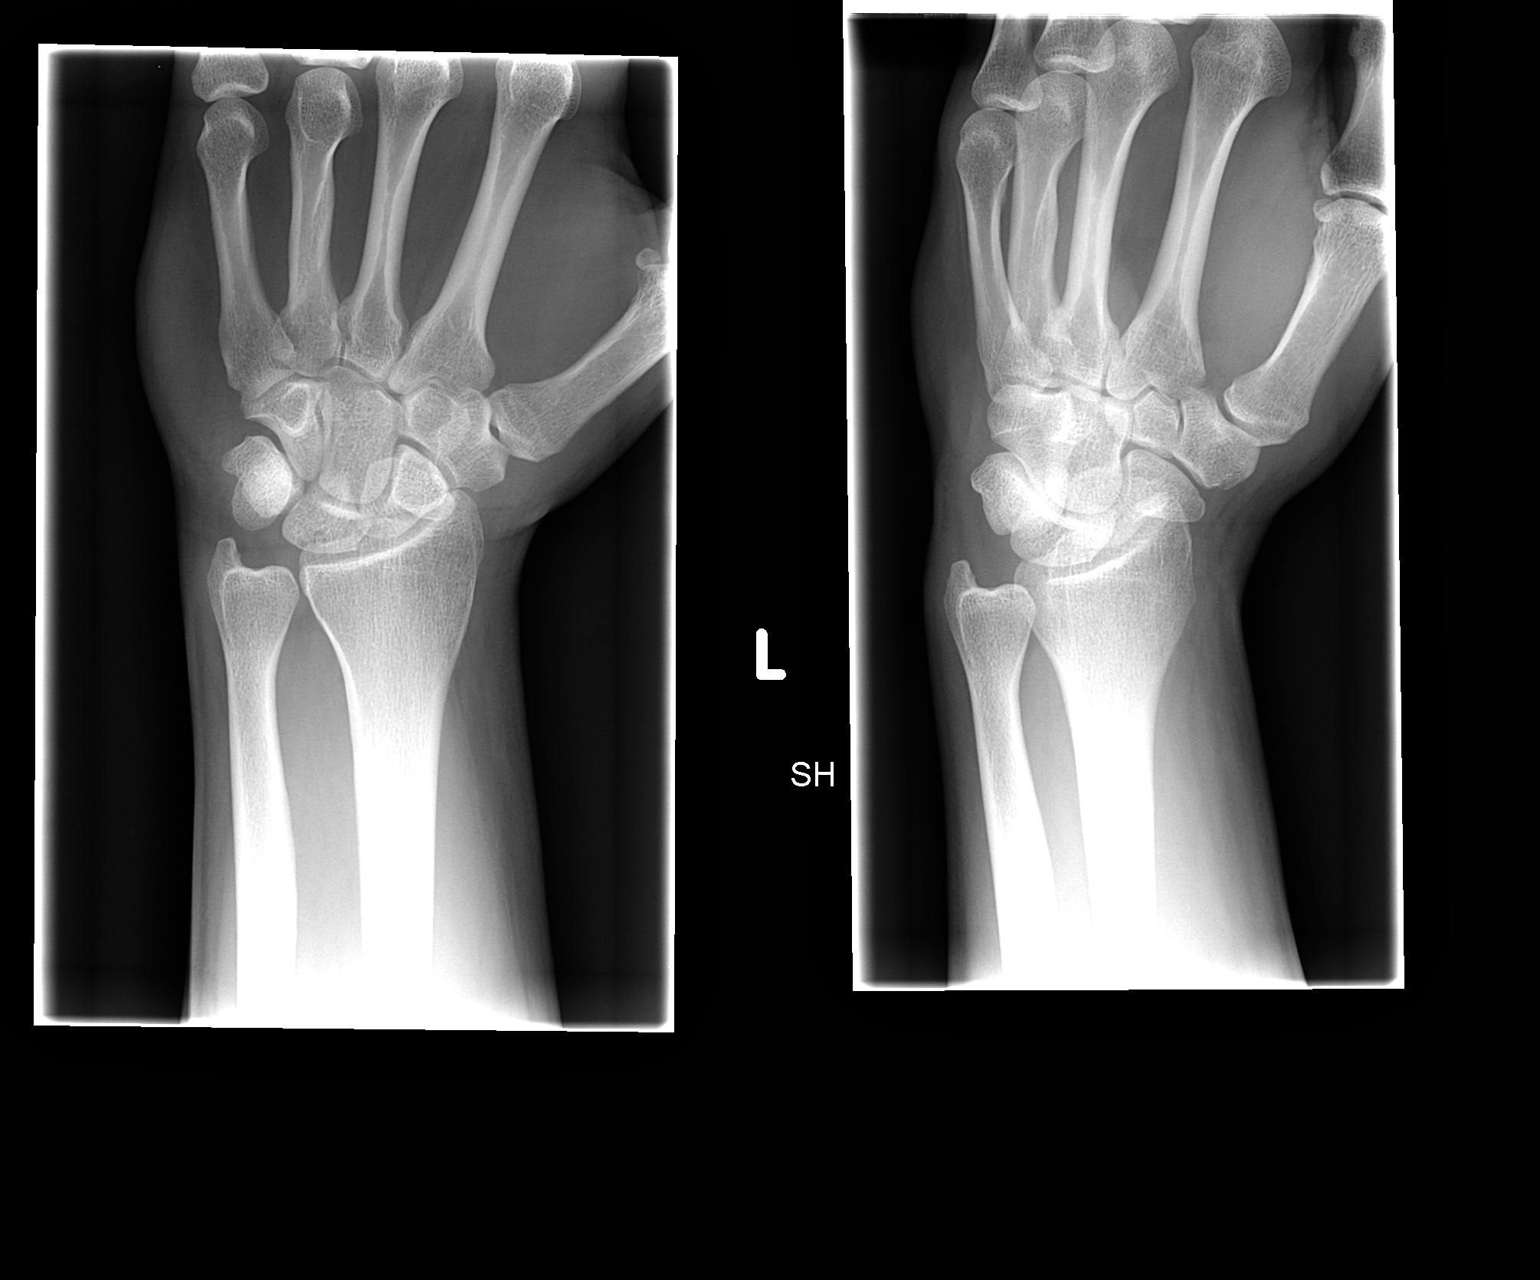

[view not recorded (2 of 2)]
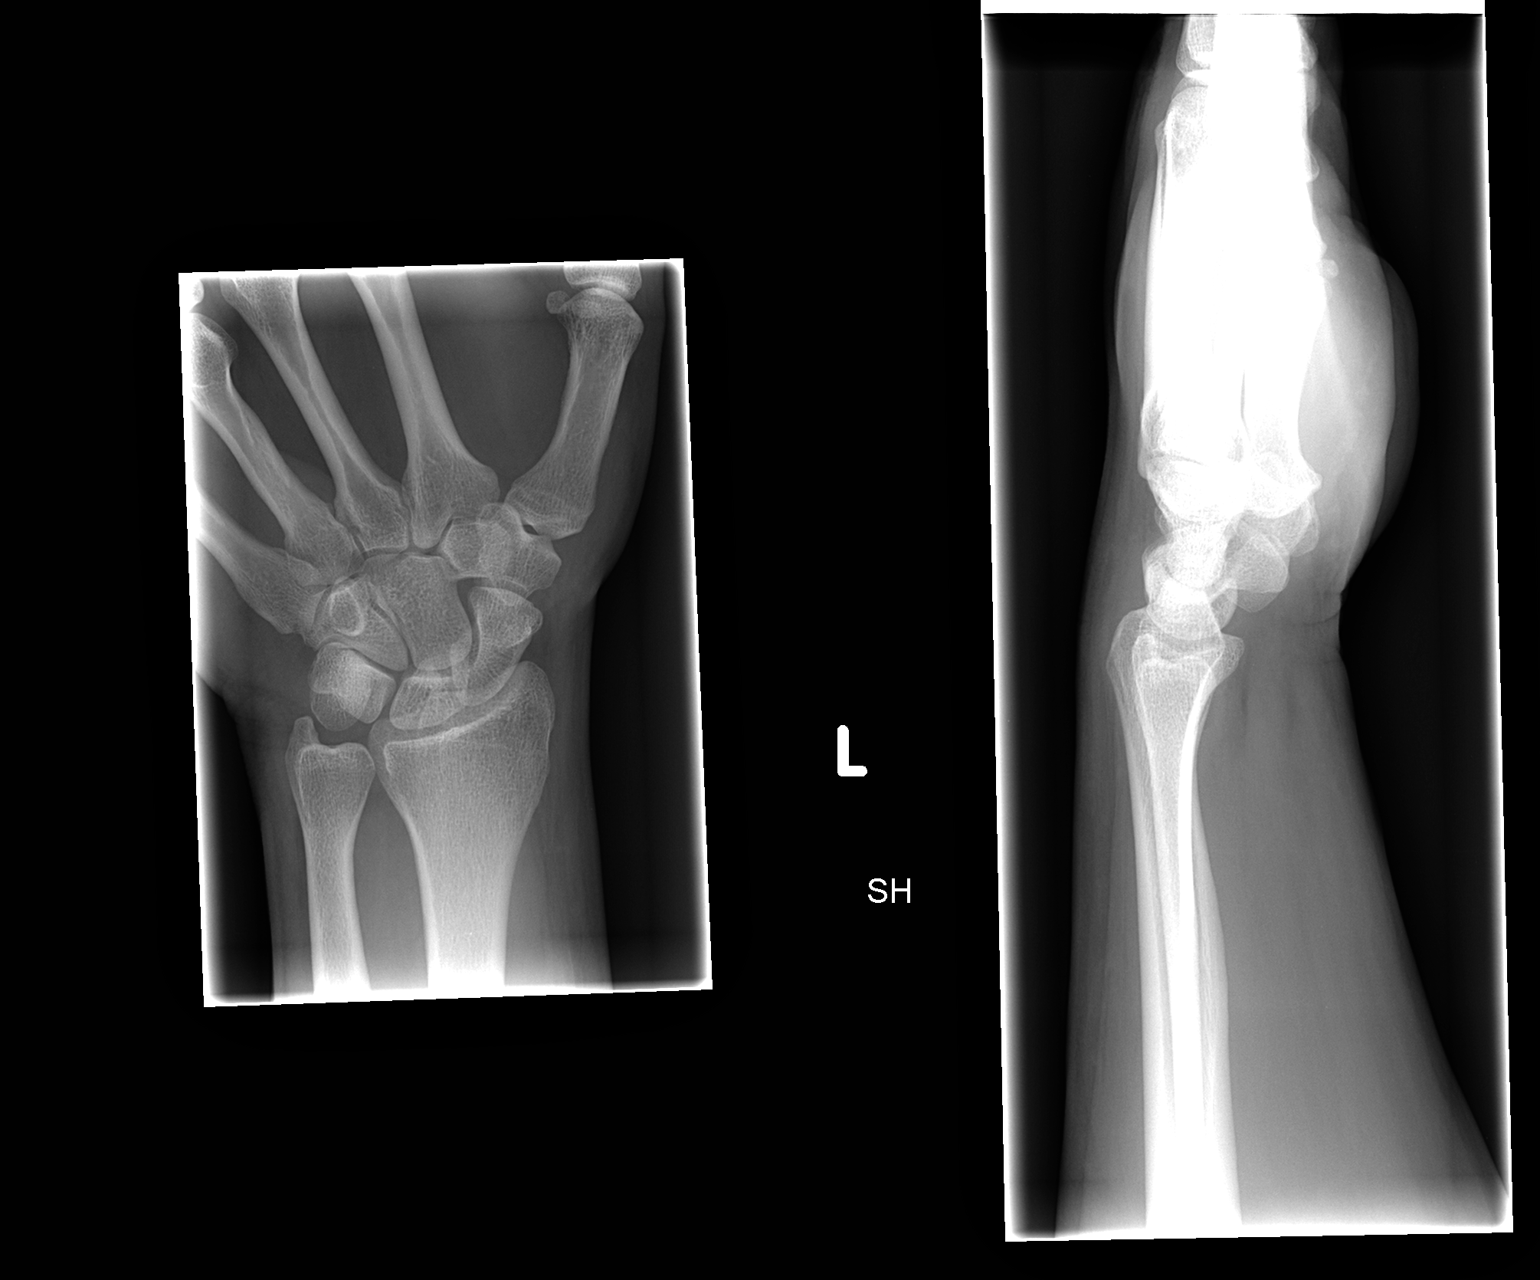

[2 of 2 positions shown; findings below may reference images not displayed]

FINDINGS: The radiocarpal joint space appears normal.  The ulnar
styloid is intact.  The carpal bones are in normal position.  No
acute abnormality is seen.
IMPRESSION: Negative.

## 2012-09-21 ENCOUNTER — Emergency Department (INDEPENDENT_AMBULATORY_CARE_PROVIDER_SITE_OTHER)
Admission: EM | Admit: 2012-09-21 | Discharge: 2012-09-21 | Disposition: A | Discharge status: Home / Self Care | Payer: BC Managed Care – PPO | Source: Home / Self Care | Attending: Family Medicine | Admitting: Family Medicine

## 2012-09-21 ENCOUNTER — Encounter: Payer: Self-pay | Admitting: Emergency Medicine

## 2012-09-21 ENCOUNTER — Emergency Department (INDEPENDENT_AMBULATORY_CARE_PROVIDER_SITE_OTHER): Payer: BC Managed Care – PPO

## 2012-09-21 DIAGNOSIS — S39012A Strain of muscle, fascia and tendon of lower back, initial encounter: Secondary | ICD-10-CM

## 2012-09-21 DIAGNOSIS — M549 Dorsalgia, unspecified: Secondary | ICD-10-CM

## 2012-09-21 MED ORDER — CYCLOBENZAPRINE HCL 5 MG PO TABS: 5.0000 mg | ORAL_TABLET | Freq: Three times a day (TID) | ORAL | Status: DC | PRN

## 2012-09-21 MED ORDER — KETOROLAC TROMETHAMINE 30 MG/ML IJ SOLN
30.0000 mg | Freq: Once | INTRAMUSCULAR | Status: AC
  Administered 2012-09-21: 30 mg via INTRAMUSCULAR

## 2012-09-21 MED ORDER — MELOXICAM 15 MG PO TABS: 15.0000 mg | ORAL_TABLET | Freq: Every day | ORAL | Status: DC

## 2012-09-21 NOTE — ED Provider Notes (Signed)
History     CSN: [id number]  Arrival date & time 09/21/12  3086   First MD Initiated Contact with Patient 09/21/12 [phone]      Chief Complaint  Patient presents with  . Back Pain    HPI  The patient presents today with back pain. Location: R lumbosacral area Timing: constant. Has been present since last night, gotten progressively worse  Description: R lumbosacral pain, severe, worse with going from sitting to standing as well as flexion and extension. No radicular sxs.  Worse with: as above  Better with: laying on L side  Trauma: no Bladder/bowel incontinence: no Weakness: no Fever/chills: no Night pain:no Unexplained weight loss: no Cancer/immunosuppression: no PMH of osteoporosis or chronic steroid use:  no   Past Medical History  Diagnosis Date  . Anxiety   . Anemia     iron deficient  . Chronic diarrhea   . Obesity   . Tobacco user   . Hyperlipemia     Past Surgical History  Procedure Laterality Date  . Tonsillectomy      Family History  Problem Relation Age of Onset  . Heart disease Father 51     MI, CAD, CABG    History  Substance Use Topics  . Smoking status: Former Games developer  . Smokeless tobacco: Not on file  . Alcohol Use: No      Review of Systems  All other systems reviewed and are negative.    Allergies  Review of patient's allergies indicates not on file.  Home Medications   Current Outpatient Rx  Name  Route  Sig  Dispense  Refill  . atorvastatin (LIPITOR) 20 MG tablet   Oral   Take 20 mg by mouth daily.           . Multiple Vitamins-Minerals (MULTIVITAMIN WITH MINERALS) tablet   Oral   Take 1 tablet by mouth daily.             BP 144/87  Pulse 84  Temp(Src) 98 F (36.7 C) (Oral)  Ht 5\' 11"  (1.803 m)  Wt 235 lb (106.595 kg)  BMI 32.79 kg/m2  SpO2 96%  Physical Exam  Constitutional: He appears well-developed and well-nourished.  HENT:  Head: Normocephalic and atraumatic.  Eyes: Pupils are equal, round, and  reactive to light.  Neck: Normal range of motion.  Cardiovascular: Normal rate and regular rhythm.   Pulmonary/Chest: Effort normal.  Abdominal: Soft.  Musculoskeletal:       Arms: + TTP over affected + Marked pain with flexion and extension over affected area FABER mildly positive bilaterally    Neurological: He is alert.  Skin: Skin is warm.    ED Course  Procedures (including critical care time)  Labs Reviewed - No data to display Dg Lumbar Spine Complete  09/21/2012  *RADIOLOGY REPORT*  Clinical Data: Back pain  LUMBAR SPINE - COMPLETE 4+ VIEW  Comparison: 07/05/2009  Findings: Anatomic alignment.  Mild narrowing of the L5 S1 disc. No vertebral compression deformity.  No pars defect.  IMPRESSION: No acute bony pathology.   Original Report Authenticated By: Jolaine Click, M.D.      1. Lumbosacral strain, initial encounter      MDM  Xrays negative for any acute process.  RICE and NSAIDs Toradol 30mg  IM x1 Discussed general care and MSK red flags.  Follow up as needed.     The patient and/or caregiver has been counseled thoroughly with regard to treatment plan and/or medications prescribed including dosage, schedule,  interactions, rationale for use, and possible side effects and they verbalize understanding. Diagnoses and expected course of recovery discussed and will return if not improved as expected or if the condition worsens. Patient and/or caregiver verbalized understanding.             Doree Albee, MD 09/21/12 1109

## 2012-09-21 NOTE — ED Notes (Signed)
Low back pain started last night, denies trauma

## 2014-01-09 ENCOUNTER — Encounter: Payer: Self-pay | Admitting: Emergency Medicine

## 2014-01-09 ENCOUNTER — Emergency Department (INDEPENDENT_AMBULATORY_CARE_PROVIDER_SITE_OTHER)
Admission: EM | Admit: 2014-01-09 | Discharge: 2014-01-09 | Disposition: A | Discharge status: Home / Self Care | Payer: BC Managed Care – PPO | Source: Home / Self Care | Attending: Emergency Medicine | Admitting: Emergency Medicine

## 2014-01-09 ENCOUNTER — Emergency Department (INDEPENDENT_AMBULATORY_CARE_PROVIDER_SITE_OTHER): Payer: BC Managed Care – PPO

## 2014-01-09 DIAGNOSIS — M25519 Pain in unspecified shoulder: Secondary | ICD-10-CM

## 2014-01-09 DIAGNOSIS — S4350XA Sprain of unspecified acromioclavicular joint, initial encounter: Secondary | ICD-10-CM

## 2014-01-09 DIAGNOSIS — S4351XA Sprain of right acromioclavicular joint, initial encounter: Secondary | ICD-10-CM

## 2014-01-09 MED ORDER — IBUPROFEN 200 MG PO TABS: ORAL_TABLET | ORAL | Status: DC

## 2014-01-09 NOTE — ED Notes (Signed)
Rt shoulder pain x 2 weeks playing with daughter in the floor and heard a pop. Pain 2-7 with activity, rt hand tingles intermittenly

## 2014-01-09 NOTE — ED Provider Notes (Signed)
CSN: [id number]     Arrival date & time 01/09/14  1900 History   First MD Initiated Contact with Patient 01/09/14 1913     Chief Complaint  Patient presents with  . Shoulder Pain   (Consider location/radiation/quality/duration/timing/severity/associated sxs/prior Treatment) HPI Sharp, anterior right shoulder pain x 2 weeks. Onset 2 weeks ago while playing with daughter in the floor and heard a pop.  Pain 2 at rest , up to 7 with certain movements /activity. Right hand tingles rarely, intermittent. Has not tried any particular treatment. Denies prior right shoulder problems . Denies exertional chest pain or dyspnea.  Past Medical History  Diagnosis Date  . Anxiety   . Anemia     iron deficient  . Chronic diarrhea   . Obesity   . Tobacco user   . Hyperlipemia    Past Surgical History  Procedure Laterality Date  . Tonsillectomy     Family History  Problem Relation Age of Onset  . Heart disease Father 90     MI, CAD, CABG   History  Substance Use Topics  . Smoking status: Current Every Day Smoker -- 1.00 packs/day    Types: Cigarettes  . Smokeless tobacco: Not on file  . Alcohol Use: No    Review of Systems  All other systems reviewed and are negative.   Allergies  Review of patient's allergies indicates no known allergies.  Home Medications   Prior to Admission medications   Medication Sig Start Date End Date Taking? Authorizing Provider  simvastatin (ZOCOR) 20 MG tablet Take 20 mg by mouth daily.   Yes Historical Provider, MD  atorvastatin (LIPITOR) 20 MG tablet Take 20 mg by mouth daily.      Historical Provider, MD  cyclobenzaprine (FLEXERIL) 5 MG tablet Take 1 tablet (5 mg total) by mouth 3 (three) times daily as needed for muscle spasms. 09/21/12   Shanda Howells, MD  ibuprofen (ADVIL,MOTRIN) 200 MG tablet Take three tablets ( 600 milligrams total) every 6 with food as needed for pain. 01/09/14   Jacqulyn Cane, MD  Multiple Vitamins-Minerals (MULTIVITAMIN WITH  MINERALS) tablet Take 1 tablet by mouth daily.      Historical Provider, MD   BP 151/95  Pulse 88  Temp(Src) 98.4 F (36.9 C) (Oral)  Ht 5\' 11"  (1.803 m)  Wt 237 lb (107.502 kg)  BMI 33.07 kg/m2  SpO2 96% Physical Exam  Nursing note and vitals reviewed. Constitutional: He is oriented to person, place, and time. He appears well-developed and well-nourished. No distress.  HENT:  Head: Normocephalic and atraumatic.  Eyes: Conjunctivae and EOM are normal. Pupils are equal, round, and reactive to light. No scleral icterus.  Neck: Normal range of motion.  Cardiovascular: Normal rate.   Pulmonary/Chest: Effort normal.  Abdominal: He exhibits no distension.  Musculoskeletal:       Right shoulder: He exhibits decreased range of motion and bony tenderness. He exhibits no swelling, no effusion, no crepitus, no deformity, no laceration and normal strength.  Right shoulder: Tender over a.c. joint. Pain exacerbated by abduction, internal and external rotation. No instability. Empty can sign negative.  Neurological: He is alert and oriented to person, place, and time.  Skin: Skin is warm. No rash noted.  Psychiatric: He has a normal mood and affect.    ED Course  Procedures (including critical care time) Labs Review Labs Reviewed - No data to display  Imaging Review Dg Shoulder Right  01/09/2014   CLINICAL DATA:  Right shoulder pain after  trampoline injury 2 weeks ago  EXAM: RIGHT SHOULDER - 2+ VIEW  COMPARISON:  None.  FINDINGS: There is no evidence of fracture or dislocation. There is no evidence of arthropathy or other focal bone abnormality. Soft tissues are unremarkable.  IMPRESSION: No acute osseous injury of the right shoulder.   Electronically Signed   By: Kathreen Devoid   On: 01/09/2014 19:39     MDM   1. Sprain of acromioclavicular ligament of right shoulder, initial encounter    Reviewed with patient that x-ray right shoulder is negative for any fracture or dislocation or bony  abnormality.  Treatment options discussed, as well as risks, benefits, alternatives. Patient voiced understanding and agreement with the following plans: Right shoulder sling applied. Relative rest, then may gradually increase passive range of motion exercises after 4-5 days . He declined any prescription pain medication or prescription NSAID. He prefers to use ibuprofen OTC, 600 mg T. ID PC.  Follow-up with ortho/sports med 5-7 days if not improving, or sooner if symptoms become worse. Precautions discussed. Red flags discussed. Questions invited and answered. Patient voiced understanding and agreement.     Jacqulyn Cane, MD 01/09/14 [phone]

## 2014-12-22 ENCOUNTER — Encounter: Payer: Self-pay | Admitting: Emergency Medicine

## 2014-12-22 ENCOUNTER — Emergency Department (INDEPENDENT_AMBULATORY_CARE_PROVIDER_SITE_OTHER)
Admission: EM | Admit: 2014-12-22 | Discharge: 2014-12-22 | Disposition: A | Discharge status: Home / Self Care | Payer: BLUE CROSS/BLUE SHIELD | Source: Home / Self Care | Attending: Family Medicine | Admitting: Family Medicine

## 2014-12-22 DIAGNOSIS — S39012A Strain of muscle, fascia and tendon of lower back, initial encounter: Secondary | ICD-10-CM | POA: Diagnosis not present

## 2014-12-22 MED ORDER — MELOXICAM 7.5 MG PO TABS: 7.5000 mg | ORAL_TABLET | Freq: Every day | ORAL | Status: DC

## 2014-12-22 MED ORDER — METHYLPREDNISOLONE ACETATE 40 MG/ML IJ SUSP
80.0000 mg | Freq: Once | INTRAMUSCULAR | Status: AC
  Administered 2014-12-22: 80 mg via INTRAMUSCULAR

## 2014-12-22 MED ORDER — CYCLOBENZAPRINE HCL 10 MG PO TABS
10.0000 mg | ORAL_TABLET | Freq: Two times a day (BID) | ORAL | Status: DC | PRN
Start: 2014-12-22 — End: 2016-09-27

## 2014-12-22 NOTE — Discharge Instructions (Signed)
You have been given an injection of a steroid in the urgent care today. This should help with inflammation and pain. Flexeril is a muscle relaxer and may cause drowsiness. Do not drink alcohol, drive, or operate heavy machinery while taking. Meloxicam (Mobic) is an antiinflammatory to help with pain and inflammation.  Do not take ibuprofen, Advil, Aleve, or any other medications that contain NSAIDs while taking meloxicam as this may cause stomach upset or even ulcers if taken in large amounts for an extended period of time.   Please call 911 or go to closest emergency department if you develop severe worsening pain that prevents you from walking, change in bowel or bladder habits, or other new concerning symptoms develop. Follow up with your primary care provider in 1-2 weeks if not improving as you may need referral to orthopedist, sports medicine or physical therapy.

## 2014-12-22 NOTE — ED Notes (Signed)
Reports low back pain in center starting after exerting activities yesterday.

## 2014-12-22 NOTE — ED Provider Notes (Signed)
CSN: [id number]     Arrival date & time 12/22/14  1732 History   First MD Initiated Contact with Patient 12/22/14 1746     Chief Complaint  Patient presents with  . Back Pain   (Consider location/radiation/quality/duration/timing/severity/associated sxs/prior Treatment) HPI The patient is a 39 year old male presenting to urgent care with complaints of gradually worsening lower back pain that started yesterday after going down some slides at a children's party.  Pain is aching and sore, worse with certain movements including pending at the waist, 7/10 at worst. He has taken Aleve with minimal relief. Pain does not radiate to arms, legs or abdomen. Denies change in bowel or bladder habits. Denies difficulty walking. Denies fever, chills, or rash.  Reports hx of back injury several years ago and had flexeril at that time. He states his back was not a painful then but does not recall any adverse side effects of the flexeril.   Past Medical History  Diagnosis Date  . Anxiety   . Anemia     iron deficient  . Chronic diarrhea   . Obesity   . Tobacco user   . Hyperlipemia    Past Surgical History  Procedure Laterality Date  . Tonsillectomy     Family History  Problem Relation Age of Onset  . Heart disease Father 81     MI, CAD, CABG   History  Substance Use Topics  . Smoking status: Current Every Day Smoker -- 1.00 packs/day    Types: Cigarettes  . Smokeless tobacco: Not on file  . Alcohol Use: No    Review of Systems  Constitutional: Negative for fever and chills.  Respiratory: Negative for cough and shortness of breath.   Cardiovascular: Negative for chest pain and palpitations.  Gastrointestinal: Negative for nausea, vomiting, abdominal pain and diarrhea.  Genitourinary: Negative for dysuria, urgency, hematuria and flank pain.  Musculoskeletal: Positive for myalgias and back pain. Negative for joint swelling, arthralgias, gait problem, neck pain and neck stiffness.  Skin:  Negative for rash and wound.  Neurological: Negative for weakness and numbness.  All other systems reviewed and are negative.   Allergies  Review of patient's allergies indicates no known allergies.  Home Medications   Prior to Admission medications   Medication Sig Start Date End Date Taking? Authorizing Provider  atorvastatin (LIPITOR) 20 MG tablet Take 20 mg by mouth daily.      Historical Provider, MD  cyclobenzaprine (FLEXERIL) 10 MG tablet Take 1 tablet (10 mg total) by mouth 2 (two) times daily as needed for muscle spasms. 12/22/14   Noland Fordyce, PA-C  cyclobenzaprine (FLEXERIL) 5 MG tablet Take 1 tablet (5 mg total) by mouth 3 (three) times daily as needed for muscle spasms. 09/21/12   Deneise Lever, MD  ibuprofen (ADVIL,MOTRIN) 200 MG tablet Take three tablets ( 600 milligrams total) every 6 with food as needed for pain. 01/09/14   Jacqulyn Cane, MD  meloxicam (MOBIC) 7.5 MG tablet Take 1 tablet (7.5 mg total) by mouth daily. Daily for 5 days, then daily as needed for pain and swelling 12/22/14   Noland Fordyce, PA-C  Multiple Vitamins-Minerals (MULTIVITAMIN WITH MINERALS) tablet Take 1 tablet by mouth daily.      Historical Provider, MD  simvastatin (ZOCOR) 20 MG tablet Take 20 mg by mouth daily.    Historical Provider, MD   There were no vitals taken for this visit. Physical Exam  Constitutional: He is oriented to person, place, and time. He appears well-developed and  well-nourished.  HENT:  Head: Normocephalic and atraumatic.  Eyes: EOM are normal.  Neck: Normal range of motion. Neck supple.  No midline bone tenderness, no crepitus or step-offs.  FROM w/o pain  Cardiovascular: Normal rate.   Pulses:      Radial pulses are 2+ on the right side, and 2+ on the left side.       Dorsalis pedis pulses are 2+ on the right side, and 2+ on the left side.  Pulmonary/Chest: Effort normal. No respiratory distress.  Abdominal: Soft. There is no tenderness.  Musculoskeletal: Normal  range of motion. He exhibits tenderness. He exhibits no edema.  Mild tenderness over lower lumbar spine and bilateral muscles. Right more tender than Left. No step offs or crepitus of spine.  FROM upper and lower extremities with 5/5 strength bilaterally.   Neurological: He is alert and oriented to person, place, and time.  Sensation in tact in upper and lower extremities bilaterally. Normal gait.  Skin: Skin is warm and dry. No rash noted. No erythema.  Psychiatric: He has a normal mood and affect. His behavior is normal.  Nursing note and vitals reviewed.   ED Course  Procedures (including critical care time) Labs Review Labs Reviewed - No data to display  Imaging Review No results found.   MDM   1. Low back strain, initial encounter     Pt c/o lower back pain. No red flag symptoms. No imaging indicated at this time.  Tx in [facility]: Depo-medrol 54mh IM Rx: flexeril and meloxicam.  Discussed using ice to help with pain. Home care instructions provided including gentle stretching exercises. F/u with PCP in 1-2 weeks if not improving, sooner if worsening. Discussed symptoms that warrant emergent care in the ED. Patient verbalized understanding and agreement with treatment plan.    Noland Fordyce, PA-C 12/22/14 [phone]

## 2015-02-07 IMAGING — CR DG SHOULDER 2+V*R*
3 series · 3 of 3 positions shown · non-contrast
Comparison: None.

CLINICAL DATA: Right shoulder pain after trampoline injury 2 weeks
ago

EXAM:
RIGHT SHOULDER - 2+ VIEW

[view not recorded (1 of 3)]
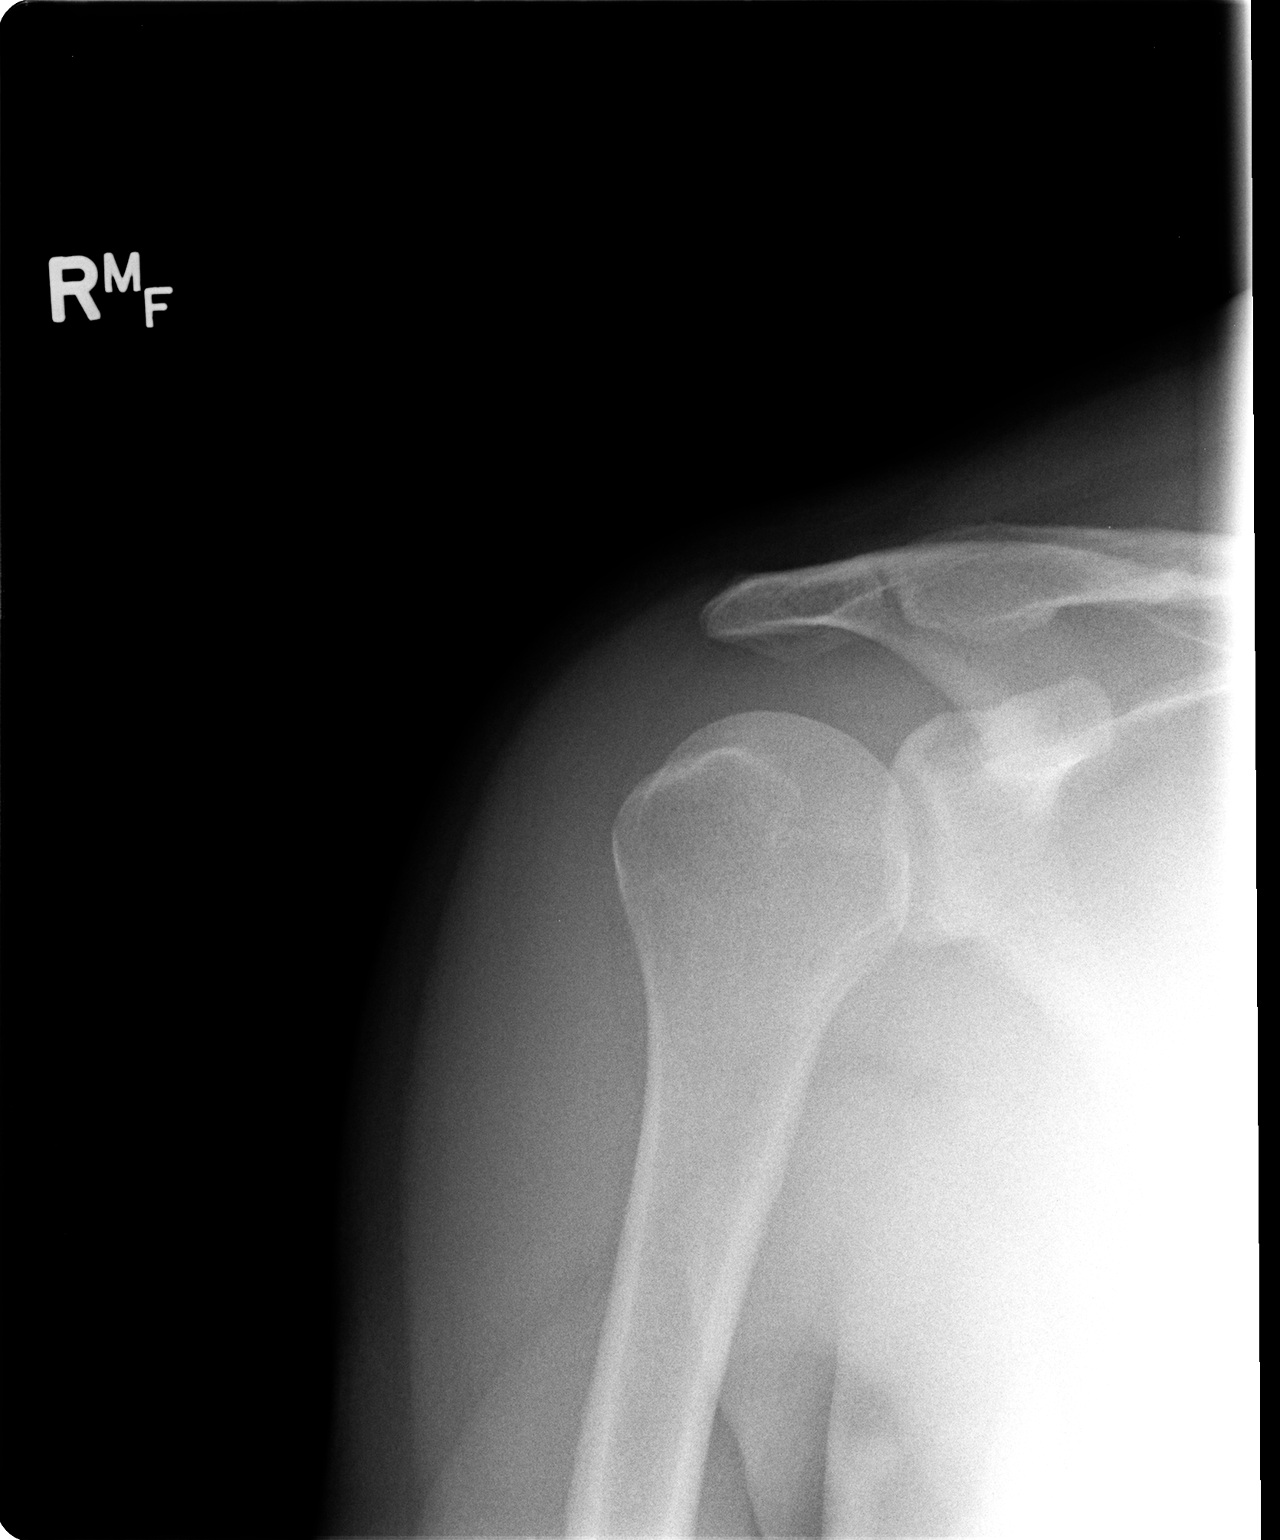

[view not recorded (2 of 3)]
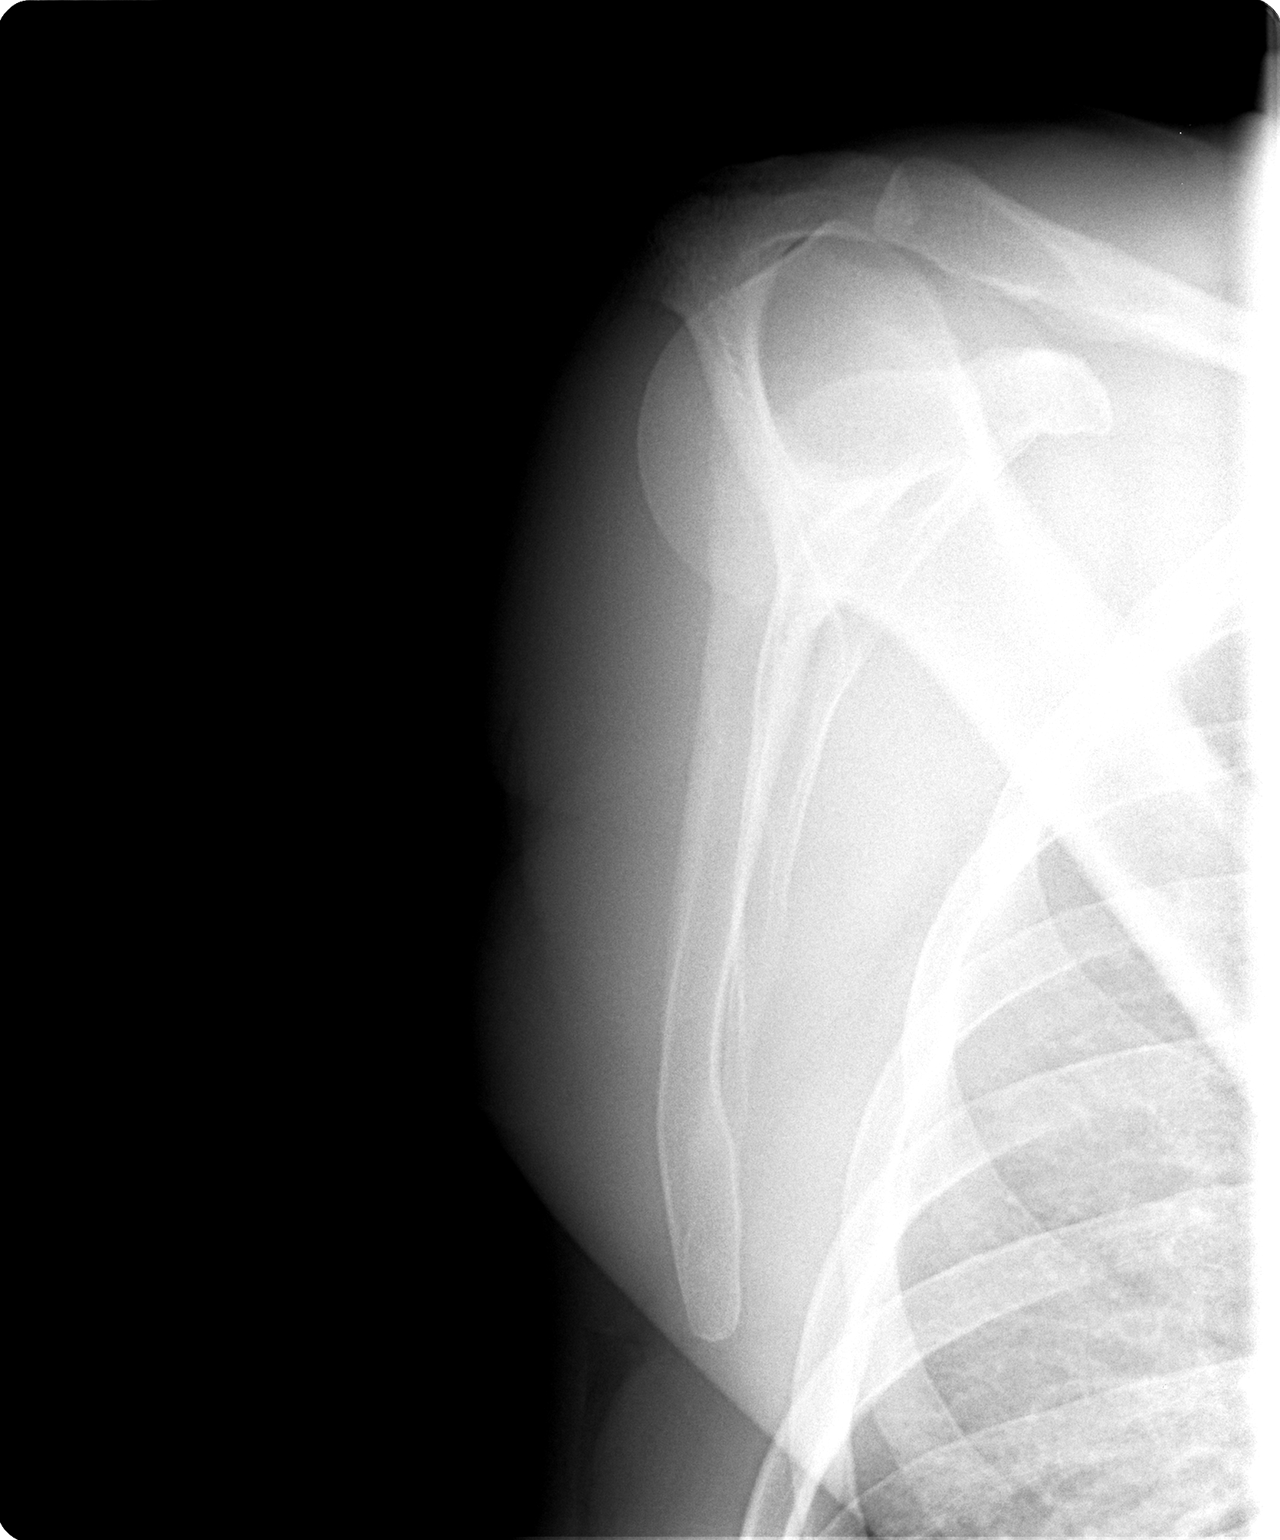

[view not recorded (3 of 3)]
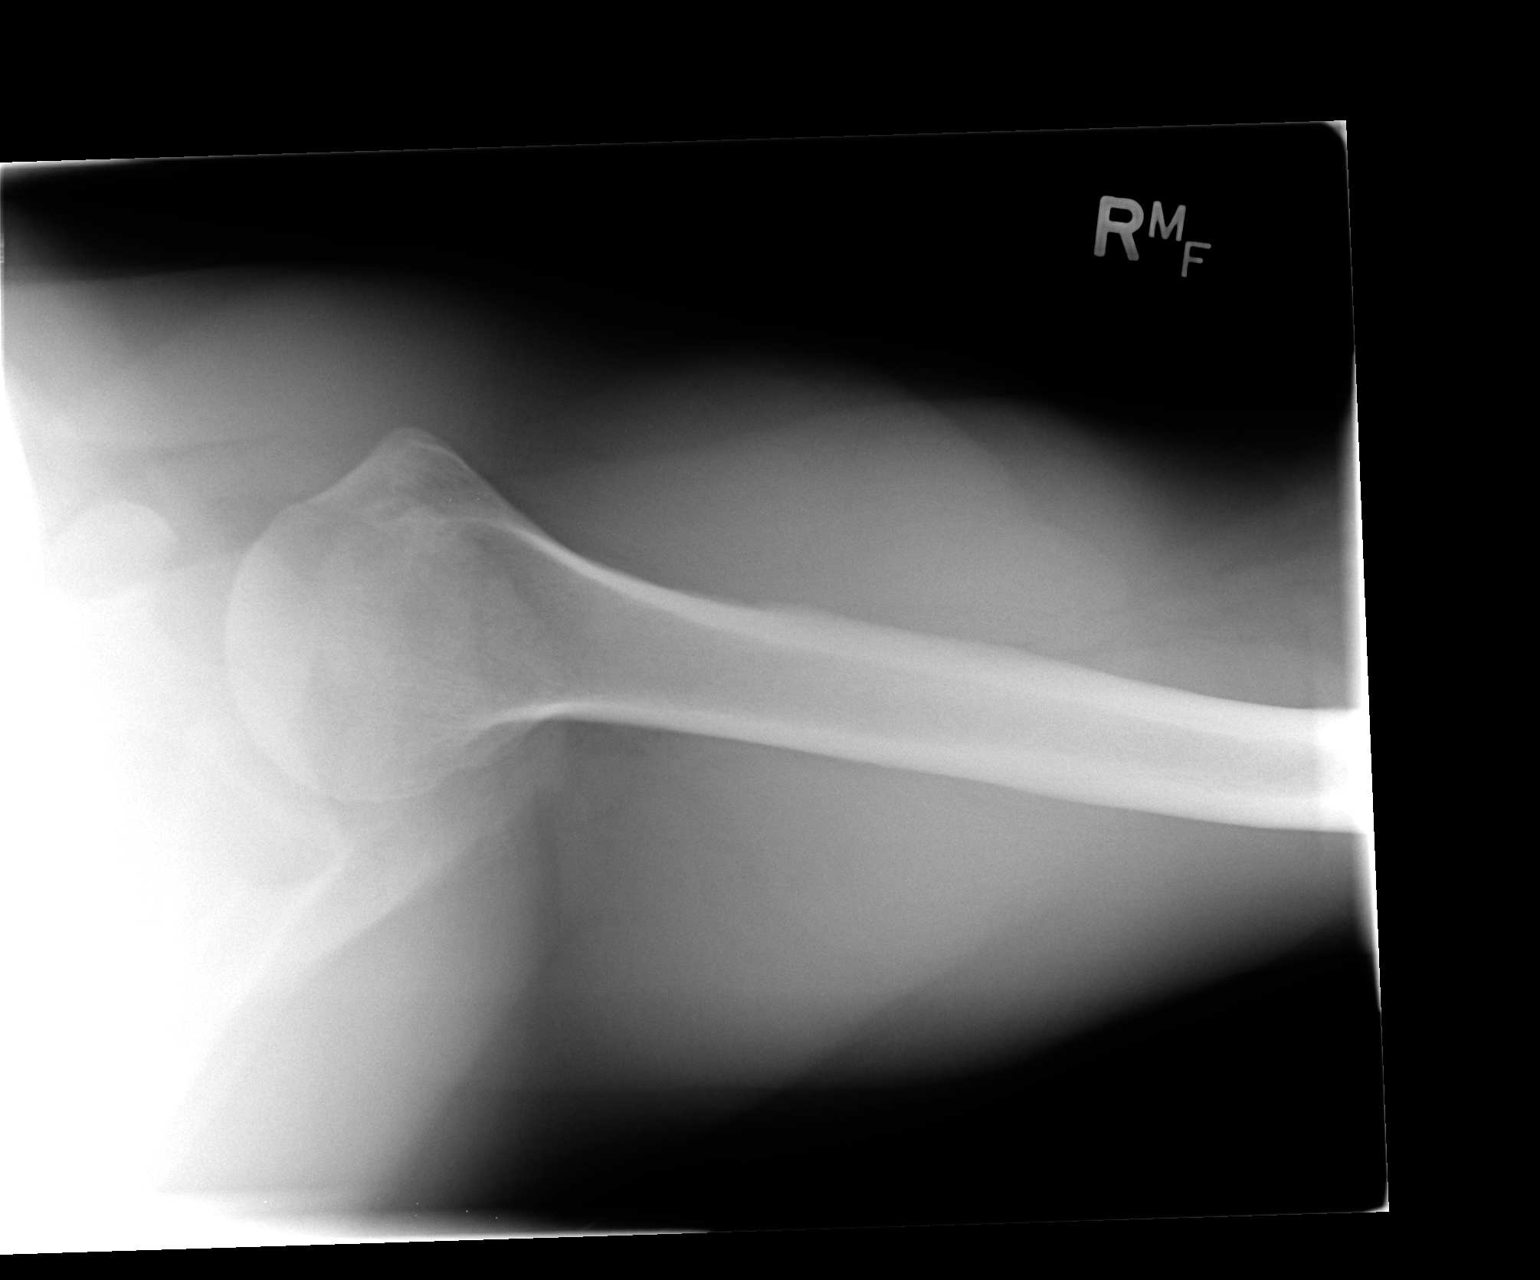

[3 of 3 positions shown; findings below may reference images not displayed]

FINDINGS: There is no evidence of fracture or dislocation. There is no
evidence of arthropathy or other focal bone abnormality. Soft
tissues are unremarkable.
IMPRESSION: No acute osseous injury of the right shoulder.

## 2015-03-19 ENCOUNTER — Encounter: Payer: Self-pay | Admitting: *Deleted

## 2015-03-19 ENCOUNTER — Emergency Department (INDEPENDENT_AMBULATORY_CARE_PROVIDER_SITE_OTHER)
Admission: EM | Admit: 2015-03-19 | Discharge: 2015-03-19 | Disposition: A | Discharge status: Home / Self Care | Payer: BLUE CROSS/BLUE SHIELD | Source: Home / Self Care | Attending: Family Medicine | Admitting: Family Medicine

## 2015-03-19 DIAGNOSIS — S0993XA Unspecified injury of face, initial encounter: Secondary | ICD-10-CM

## 2015-03-19 MED ORDER — AMOXICILLIN 875 MG PO TABS: 875.0000 mg | ORAL_TABLET | Freq: Two times a day (BID) | ORAL | Status: DC

## 2015-03-19 NOTE — ED Notes (Signed)
Pt reports tripping and falling into wall today, broke a tooth. Dentist told him to receive an antibiotic somewhere, he will be seen Monday at dentist office.

## 2015-03-19 NOTE — ED Provider Notes (Signed)
CSN: [id number]     Arrival date & time 03/19/15  1717 History   None    Chief Complaint  Patient presents with  . Dental Injury      HPI Comments: Patient reports that he tripped and his face bumped a wall, resulting in a broken upper anterior tooth.  No other injury.  No loss of consciousness.  No facial swelling or pain. His dentist advised that he be evaluated at an urgent care center and start an antibiotic.  He will follow-up with his dentist in four days.  Patient is a 39 y.o. male presenting with dental injury. The history is provided by the patient.  Dental Injury This is a new problem. The current episode started less than 1 hour ago. The problem has not changed since onset.Pertinent negatives include no headaches. Exacerbated by: chewing. Nothing relieves the symptoms. He has tried nothing for the symptoms.    Past Medical History  Diagnosis Date  . Anxiety   . Anemia     iron deficient  . Chronic diarrhea   . Obesity   . Tobacco user   . Hyperlipemia    Past Surgical History  Procedure Laterality Date  . Tonsillectomy     Family History  Problem Relation Age of Onset  . Heart disease Father 66     MI, CAD, CABG   Social History  Substance Use Topics  . Smoking status: Current Every Day Smoker -- 1.00 packs/day    Types: Cigarettes  . Smokeless tobacco: None  . Alcohol Use: No    Review of Systems  Neurological: Negative for headaches.  All other systems reviewed and are negative.   Allergies  Review of patient's allergies indicates no known allergies.  Home Medications   Prior to Admission medications   Medication Sig Start Date End Date Taking? Authorizing Provider  amoxicillin (AMOXIL) 875 MG tablet Take 1 tablet (875 mg total) by mouth 2 (two) times daily. 03/19/15   Kandra Nicolas, MD  atorvastatin (LIPITOR) 20 MG tablet Take 20 mg by mouth daily.      Historical Provider, MD  cyclobenzaprine (FLEXERIL) 10 MG tablet Take 1 tablet (10 mg total)  by mouth 2 (two) times daily as needed for muscle spasms. 12/22/14   Noland Fordyce, PA-C  meloxicam (MOBIC) 7.5 MG tablet Take 1 tablet (7.5 mg total) by mouth daily. Daily for 5 days, then daily as needed for pain and swelling 12/22/14   Noland Fordyce, PA-C  Multiple Vitamins-Minerals (MULTIVITAMIN WITH MINERALS) tablet Take 1 tablet by mouth daily.      Historical Provider, MD  PARoxetine (PAXIL) 20 MG tablet Take 20 mg by mouth daily.    Historical Provider, MD  simvastatin (ZOCOR) 20 MG tablet Take 20 mg by mouth daily.    Historical Provider, MD   Meds Ordered and Administered this Visit  Medications - No data to display  BP 124/89 mmHg  Pulse 95  Temp(Src) 98.3 F (36.8 C) (Oral)  Resp 16  Wt 242 lb (109.77 kg)  SpO2 98% No data found.   Physical Exam  Constitutional: He appears well-developed and well-nourished. No distress.  HENT:  Head: Atraumatic.  Right Ear: Tympanic membrane, external ear and ear canal normal.  Left Ear: Tympanic membrane, external ear and ear canal normal.  Nose: Nose normal.  Mouth/Throat: Oropharynx is clear and moist and mucous membranes are normal. No oral lesions. Abnormal dentition. Dental caries present. No dental abscesses or lacerations.    Tooth #  5 right maxillary area is fractured and partly missing.  No tenderness to tap.  No gingival injury  Eyes: Conjunctivae and EOM are normal. Pupils are equal, round, and reactive to light.  Neck: Normal range of motion.  Nursing note and vitals reviewed.   ED Course  Procedures none     MDM   1. Tooth injury, initial encounter    Begin amoxicillin 875mg  BID May take Ibuprofen 200mg , 4 tabs every 8 hours with food, if needed for pain. Followup with dentist as scheduled    Kandra Nicolas, MD 03/22/15 [phone]

## 2015-03-19 NOTE — Discharge Instructions (Signed)
May take Ibuprofen 200mg , 4 tabs every 8 hours with food, if needed for pain.

## 2016-09-27 ENCOUNTER — Encounter: Payer: Self-pay | Admitting: *Deleted

## 2016-09-27 ENCOUNTER — Other Ambulatory Visit: Payer: Self-pay

## 2016-09-27 ENCOUNTER — Emergency Department (INDEPENDENT_AMBULATORY_CARE_PROVIDER_SITE_OTHER)
Admission: EM | Admit: 2016-09-27 | Discharge: 2016-09-27 | Disposition: A | Discharge status: Home / Self Care | Payer: BLUE CROSS/BLUE SHIELD | Source: Home / Self Care | Attending: Family Medicine | Admitting: Family Medicine

## 2016-09-27 DIAGNOSIS — M25512 Pain in left shoulder: Secondary | ICD-10-CM

## 2016-09-27 DIAGNOSIS — R9431 Abnormal electrocardiogram [ECG] [EKG]: Secondary | ICD-10-CM

## 2016-09-27 DIAGNOSIS — R0789 Other chest pain: Secondary | ICD-10-CM | POA: Diagnosis not present

## 2016-09-27 MED ORDER — ASPIRIN 325 MG PO TABS
325.0000 mg | ORAL_TABLET | Freq: Once | ORAL | Status: AC
  Administered 2016-09-27: 325 mg via ORAL

## 2016-09-27 NOTE — ED Triage Notes (Signed)
Pt c/o sharp LT shoulder pain x 1200 today. Denies injury.

## 2016-09-27 NOTE — ED Provider Notes (Signed)
CSN: [id number]     Arrival date & time 09/27/16  1700 History   First MD Initiated Contact with Patient 09/27/16 1720     Chief Complaint  Patient presents with  . Shoulder Pain   (Consider location/radiation/quality/duration/timing/severity/associated sxs/prior Treatment) HPI  Joseph Calderon is a 41 y.o. male presenting to UC with c/o sudden onset Left upper chest and shoulder pain that started while he was driving around lunch today.  Pain is a dull ache. Not much worse with movement or palpation. Does not radiate.  No known injury.  Mild intermittent numbness down Left arm.  He has not taken anything for pain as he does not like taking medication until pain is severe.  He does report looking online about his symptoms and is concerned about his heart. No hx of CAD or known family hx of CAD. He does have hx of high cholesterol.   Past Medical History:  Diagnosis Date  . Anemia    iron deficient  . Anxiety   . Chronic diarrhea   . Hyperlipemia   . Obesity   . Tobacco user    Past Surgical History:  Procedure Laterality Date  . TONSILLECTOMY     Family History  Problem Relation Age of Onset  . Heart disease Father 42     MI, CAD, CABG   Social History  Substance Use Topics  . Smoking status: Current Every Day Smoker    Packs/day: 1.00    Types: Cigarettes  . Smokeless tobacco: Never Used  . Alcohol use No    Review of Systems  Constitutional: Negative for chills, diaphoresis, fatigue and fever.  Cardiovascular: Positive for chest pain. Negative for palpitations.  Gastrointestinal: Negative for nausea and vomiting.  Musculoskeletal: Positive for arthralgias. Negative for back pain and myalgias.  Skin: Negative for color change and wound.  Neurological: Positive for numbness and headaches. Negative for dizziness, weakness and light-headedness.    Allergies  Patient has no known allergies.  Home Medications   Prior to Admission medications   Medication Sig Start  Date End Date Taking? Authorizing Provider  atorvastatin (LIPITOR) 20 MG tablet Take 20 mg by mouth daily.     Yes Historical Provider, MD  PARoxetine (PAXIL) 20 MG tablet Take 20 mg by mouth daily.   Yes Historical Provider, MD  Multiple Vitamins-Minerals (MULTIVITAMIN WITH MINERALS) tablet Take 1 tablet by mouth daily.      Historical Provider, MD  simvastatin (ZOCOR) 20 MG tablet Take 20 mg by mouth daily.    Historical Provider, MD   Meds Ordered and Administered this Visit   Medications  aspirin tablet 325 mg (325 mg Oral Given 09/27/16 1740)    BP 134/90 (BP Location: Left Arm)   Pulse 76   Temp 98.5 F (36.9 C) (Oral)   Resp 18   Ht 5\' 11"  (1.803 m)   Wt 244 lb (110.7 kg)   SpO2 96%   BMI 34.03 kg/m  No data found.   Physical Exam  Constitutional: He is oriented to person, place, and time. He appears well-developed and well-nourished. No distress.  HENT:  Head: Normocephalic and atraumatic.  Mouth/Throat: Oropharynx is clear and moist.  Eyes: EOM are normal.  Neck: Normal range of motion. Neck supple.  Cardiovascular: Normal rate and regular rhythm.   Pulmonary/Chest: Effort normal and breath sounds normal. No respiratory distress. He has no wheezes. He has no rales. He exhibits tenderness.    Left upper chest tenderness. No crepitus. No rash  or ecchymosis.  Musculoskeletal: Normal range of motion.  Neurological: He is alert and oriented to person, place, and time.  Skin: Skin is warm and dry. He is not diaphoretic.  Psychiatric: He has a normal mood and affect. His behavior is normal.  Nursing note and vitals reviewed.   Urgent Care Course     Procedures (including critical care time)  Labs Review Labs Reviewed - No data to display  Imaging Review No results found.   Date/Time:09/27/2016   17:30:38 Ventricular Rate: 74 PR Interval: 150 QRS Duration: 96 QT Interval: 380 QTC Calculation: 421 P-R-T axes: 32-91-35 Text Interpretation: Normal sinus  rhythm. Rightward axes. Incomplete Right bundle branch block. Borderline ECG  Reviewed from interpretation of EKG on 03/11/2010- Only text interpretation available: Sinus rhythm, RSR (V1), non-diagnositc. Right axis, -may be normal for age.    MDM   1. Atypical chest pain   2. Acute pain of left shoulder   3. Abnormal EKG    Recommended pt go to emergency department for further workup.  Pt in good condition with normal Vitals to drive himself to [facility] via POV . 325mg  aspirin given prior to discharge     Noland Fordyce, PA-C 09/27/16 1903

## 2017-07-18 ENCOUNTER — Encounter: Payer: Self-pay | Admitting: *Deleted

## 2017-07-18 ENCOUNTER — Emergency Department (INDEPENDENT_AMBULATORY_CARE_PROVIDER_SITE_OTHER)
Admission: EM | Admit: 2017-07-18 | Discharge: 2017-07-18 | Disposition: A | Discharge status: Home / Self Care | Payer: 59 | Source: Home / Self Care | Attending: Family Medicine | Admitting: Family Medicine

## 2017-07-18 ENCOUNTER — Other Ambulatory Visit: Payer: Self-pay

## 2017-07-18 DIAGNOSIS — S39012A Strain of muscle, fascia and tendon of lower back, initial encounter: Secondary | ICD-10-CM

## 2017-07-18 MED ORDER — HYDROCODONE-ACETAMINOPHEN 5-325 MG PO TABS: 1.0000 | ORAL_TABLET | Freq: Four times a day (QID) | ORAL | 0 refills | Status: DC | PRN

## 2017-07-18 MED ORDER — CYCLOBENZAPRINE HCL 10 MG PO TABS: 10.0000 mg | ORAL_TABLET | Freq: Every day | ORAL | 0 refills | Status: DC

## 2017-07-18 NOTE — Discharge Instructions (Signed)
Apply ice pack for 20 to 30 minutes, 3 to 4 times daily  Continue until pain and swelling decrease.  May take two Aleve tabs every 12 hours with food. Begin range of motion and stretching exercises as tolerated.

## 2017-07-18 NOTE — ED Provider Notes (Signed)
[facility]    CSN: [id number] Arrival date & time: 07/18/17  0807     History   Chief Complaint Chief Complaint  Patient presents with  . Back Pain    HPI Joseph Calderon is a 42 y.o. male.   Midday yesterday patient suddenly jumped up from a chair to pick up his puppy that was beginning to urinate on a rug.  He suddenly felt a vague pulling sensation in his mid lower back.  The pain was tolerable until about 6pm when he applied a heating pad.  The pain continued to increase after he took a hot shower at 11pm, and he had difficulty sleeping.   He denies bowel or bladder dysfunction, and no saddle numbness.  No history of back pain.  The pain improved with Aleve.   The history is provided by the patient.  Back Pain  Location:  Lumbar spine Quality:  Aching Radiates to:  Does not radiate Pain severity:  Moderate Pain is:  Worse during the night Onset quality:  Gradual Duration:  1 day Timing:  Constant Progression:  Unchanged Chronicity:  New Context comment:  Standing up quickly after bending over Relieved by:  Nothing Worsened by:  Movement and sitting Ineffective treatments:  Heating pad Associated symptoms: no abdominal pain, no abdominal swelling, no bladder incontinence, no bowel incontinence, no dysuria, no fever, no leg pain, no numbness, no paresthesias, no pelvic pain, no perianal numbness, no tingling, no weakness and no weight loss     Past Medical History:  Diagnosis Date  . Anemia    iron deficient  . Anxiety   . Chronic diarrhea   . Hyperlipemia   . Obesity   . Tobacco user     Patient Active Problem List   Diagnosis Date Noted  . CELLULITIS, NECK 01/25/2011  . SKIN TAG 01/25/2011  . RASH AND OTHER NONSPECIFIC SKIN ERUPTION 01/25/2011  . Sinusitis 10/27/2010  . Elevated alkaline phosphatase level 09/27/2010  . INSOMNIA 03/11/2010  . PALPITATIONS 03/11/2010  . LOW BACK PAIN, MILD 07/05/2009  . INGUINAL PAIN, LEFT 01/30/2009  .  ANEMIA-IRON DEFICIENCY 07/08/2008  . NEOPLASM OF UNCERTAIN BEHAVIOR OF SKIN 05/05/2008  . HYPERLIPIDEMIA 05/05/2008  . OBESITY 05/05/2008  . ANXIETY 05/05/2008  . DIARRHEA, CHRONIC 08/16/2007    Past Surgical History:  Procedure Laterality Date  . TONSILLECTOMY         Home Medications    Prior to Admission medications   Medication Sig Start Date End Date Taking? Authorizing Provider  simvastatin (ZOCOR) 20 MG tablet Take 20 mg by mouth daily.   Yes [provider]  atorvastatin (LIPITOR) 20 MG tablet Take 20 mg by mouth daily.      [provider]  cyclobenzaprine (FLEXERIL) 10 MG tablet Take 1 tablet (10 mg total) by mouth at bedtime. 07/18/17   Kandra Nicolas, MD  HYDROcodone-acetaminophen (NORCO/VICODIN) 5-325 MG tablet Take 1 tablet by mouth every 6 (six) hours as needed for moderate pain. 07/18/17   Kandra Nicolas, MD  Multiple Vitamins-Minerals (MULTIVITAMIN WITH MINERALS) tablet Take 1 tablet by mouth daily.      [provider]    Family History Family History  Problem Relation Age of Onset  . Heart disease Father 26        MI, CAD, CABG    Social History Social History   Tobacco Use  . Smoking status: Current Every Day Smoker    Packs/day: 1.00    Types: Cigarettes  .  Smokeless tobacco: Never Used  Substance Use Topics  . Alcohol use: No  . Drug use: No     Allergies   Patient has no known allergies.   Review of Systems Review of Systems  Constitutional: Negative for fever and weight loss.  Gastrointestinal: Negative for abdominal pain and bowel incontinence.  Genitourinary: Negative for bladder incontinence, dysuria and pelvic pain.  Musculoskeletal: Positive for back pain.  Neurological: Negative for tingling, weakness, numbness and paresthesias.  All other systems reviewed and are negative.    Physical Exam Triage Vital Signs ED Triage Vitals  Enc Vitals Group     BP 07/18/17 0827 (!) 150/102     Pulse Rate  07/18/17 0827 84     Resp 07/18/17 0827 16     Temp 07/18/17 0827 97.8 F (36.6 C)     Temp Source 07/18/17 0827 Oral     SpO2 07/18/17 0827 97 %     Weight 07/18/17 0828 245 lb (111.1 kg)     Height 07/18/17 0828 5\' 11"  (1.803 m)     Head Circumference --      Peak Flow --      Pain Score 07/18/17 0828 2     Pain Loc --      Pain Edu? --      Excl. in [place]? --    No data found.  Updated Vital Signs BP (!) 150/102 (BP Location: Right Arm)   Pulse 84   Temp 97.8 F (36.6 C) (Oral)   Resp 16   Ht 5\' 11"  (1.803 m)   Wt 245 lb (111.1 kg)   SpO2 97%   BMI 34.17 kg/m   Visual Acuity Right Eye Distance:   Left Eye Distance:   Bilateral Distance:    Right Eye Near:   Left Eye Near:    Bilateral Near:     Physical Exam  Constitutional: He appears well-developed and well-nourished. No distress.  HENT:  Head: Normocephalic.  Eyes: Conjunctivae are normal. Pupils are equal, round, and reactive to light.  Neck: Normal range of motion.  Cardiovascular: Normal heart sounds.  Pulmonary/Chest: Breath sounds normal.  Abdominal: There is no tenderness.  Musculoskeletal: He exhibits no edema.       Lumbar back: He exhibits decreased range of motion and tenderness. He exhibits no swelling.       Back:  Back:   Can heel/toe walk and squat without difficulty.  Decreased forward flexion.  Tenderness in the midline from L3 to L4.  Straight leg raising test is negative.  Sitting knee extension test is negative.  Strength and sensation in the lower extremities is normal.  Patellar and achilles reflexes are normal   Neurological: He is alert.  Skin: Skin is warm and dry.  Nursing note and vitals reviewed.    UC Treatments / Results  Labs (all labs ordered are listed, but only abnormal results are displayed) Labs Reviewed - No data to display  EKG  EKG Interpretation None       Radiology No results found.  Procedures Procedures (including critical care time)  Medications  Ordered in UC Medications - No data to display   Initial Impression / Assessment and Plan / UC Course  I have reviewed the triage vital signs and the nursing notes.  Pertinent labs & imaging results that were available during my care of the patient were reviewed by me and considered in my medical decision making (see chart for details).    Begin Flexeril  10mg  HS.  Rx Lortab for bedtime. Controlled Substance Prescriptions I have consulted the [place] Controlled Substances Registry for this patient, and feel the risk/benefit ratio today is favorable for proceeding with this prescription for a controlled substance.   Apply ice pack for 20 to 30 minutes, 3 to 4 times daily  Continue until pain and swelling decrease.  May take two Aleve tabs every 12 hours with food. Begin range of motion and stretching exercises as tolerated. Followup with Dr. Aundria Mems or Dr. Lynne Leader ([facility]) if not improving about two weeks.   Note elevated BP today.  Patient states that it is usually normal. Monitor blood pressure more frequently at different times of day and record on a calendar.  Followup with PCP if remains elevated.    Final Clinical Impressions(s) / UC Diagnoses   Final diagnoses:  Lumbar strain, initial encounter    ED Discharge Orders        Ordered    cyclobenzaprine (FLEXERIL) 10 MG tablet  Daily at bedtime     07/18/17 0901    HYDROcodone-acetaminophen (NORCO/VICODIN) 5-325 MG tablet  Every 6 hours PRN     07/18/17 0901          Kandra Nicolas, MD 07/18/17 [phone]

## 2017-07-18 NOTE — ED Triage Notes (Signed)
Pt c/o LBP x 1 day after bending over to pick up his puppy. He took Aleve last night and this morning.

## 2018-03-27 ENCOUNTER — Encounter: Payer: Self-pay | Admitting: Emergency Medicine

## 2018-03-27 ENCOUNTER — Emergency Department (INDEPENDENT_AMBULATORY_CARE_PROVIDER_SITE_OTHER)
Admission: EM | Admit: 2018-03-27 | Discharge: 2018-03-27 | Disposition: A | Discharge status: Home / Self Care | Payer: 59 | Source: Home / Self Care | Attending: Family Medicine | Admitting: Family Medicine

## 2018-03-27 DIAGNOSIS — S46911A Strain of unspecified muscle, fascia and tendon at shoulder and upper arm level, right arm, initial encounter: Secondary | ICD-10-CM

## 2018-03-27 DIAGNOSIS — F410 Panic disorder [episodic paroxysmal anxiety] without agoraphobia: Secondary | ICD-10-CM

## 2018-03-27 DIAGNOSIS — R200 Anesthesia of skin: Secondary | ICD-10-CM

## 2018-03-27 DIAGNOSIS — R202 Paresthesia of skin: Secondary | ICD-10-CM

## 2018-03-27 NOTE — ED Triage Notes (Signed)
Pt c/o right arm pain since yesterday. Lifted his dog on Sunday pain may be from that. States this am his left arm started tingling. He has hx of anxiety and states he thinks that may be what he is feeling. He started paxil again of Friday. He has appt with pcp tomorrow.

## 2018-03-27 NOTE — ED Provider Notes (Signed)
[facility]    CSN: [id number] Arrival date & time: 03/27/18  1416     History   Chief Complaint Chief Complaint  Patient presents with  . Extremity Weakness    HPI Joseph Calderon is a 42 y.o. male.   HPI Joseph Calderon is a 42 y.o. male presenting to UC with c/o Right shoulder pain/discomfort that started yesterday after carrying his 40lb dog the day before.  He also developed Left arm numbness/tingling about 90 minutes PTA. He believes it is due to his anxiety due to increased stress at work recently but wanted to be sure.  He did start Paxil 4 days ago after stopping the medication about 1 year ago because he did not feel like it was helping with his anxiety at the time.  He does have f/u with his PCP tomorrow.  He does not have any PRN anxiety medication. Denies chest pain or SOB.    Past Medical History:  Diagnosis Date  . Anemia    iron deficient  . Anxiety   . Chronic diarrhea   . Hyperlipemia   . Obesity   . Tobacco user     Patient Active Problem List   Diagnosis Date Noted  . CELLULITIS, NECK 01/25/2011  . SKIN TAG 01/25/2011  . RASH AND OTHER NONSPECIFIC SKIN ERUPTION 01/25/2011  . Sinusitis 10/27/2010  . Elevated alkaline phosphatase level 09/27/2010  . INSOMNIA 03/11/2010  . PALPITATIONS 03/11/2010  . LOW BACK PAIN, MILD 07/05/2009  . INGUINAL PAIN, LEFT 01/30/2009  . ANEMIA-IRON DEFICIENCY 07/08/2008  . NEOPLASM OF UNCERTAIN BEHAVIOR OF SKIN 05/05/2008  . HYPERLIPIDEMIA 05/05/2008  . OBESITY 05/05/2008  . ANXIETY 05/05/2008  . DIARRHEA, CHRONIC 08/16/2007    Past Surgical History:  Procedure Laterality Date  . TONSILLECTOMY         Home Medications    Prior to Admission medications   Medication Sig Start Date End Date Taking? Authorizing Provider  PARoxetine (PAXIL) 30 MG tablet Take 30 mg by mouth daily.   Yes [provider]  atorvastatin (LIPITOR) 20 MG tablet Take 20 mg by mouth daily.      [provider]  cyclobenzaprine (FLEXERIL) 10 MG tablet Take 1 tablet (10 mg total) by mouth at bedtime. 07/18/17   Kandra Nicolas, MD  HYDROcodone-acetaminophen (NORCO/VICODIN) 5-325 MG tablet Take 1 tablet by mouth every 6 (six) hours as needed for moderate pain. 07/18/17   Kandra Nicolas, MD  Multiple Vitamins-Minerals (MULTIVITAMIN WITH MINERALS) tablet Take 1 tablet by mouth daily.      [provider]  simvastatin (ZOCOR) 20 MG tablet Take 20 mg by mouth daily.    [provider]    Family History Family History  Problem Relation Age of Onset  . Heart disease Father 4        MI, CAD, CABG    Social History Social History   Tobacco Use  . Smoking status: Current Some Day Smoker    Packs/day: 0.50    Types: Cigarettes  . Smokeless tobacco: Never Used  . Tobacco comment: states trying to quit, using patch  Substance Use Topics  . Alcohol use: No  . Drug use: No     Allergies   Patient has no known allergies.   Review of Systems Review of Systems  Constitutional: Negative for chills, diaphoresis, fatigue and fever.  Respiratory: Negative for chest tightness, shortness of breath and wheezing.   Cardiovascular: Negative for chest pain and palpitations.  Musculoskeletal: Positive for myalgias. Negative for arthralgias, back pain, neck pain and neck stiffness.  Skin: Negative for color change and rash.  Neurological: Positive for weakness and numbness.     Physical Exam Triage Vital Signs ED Triage Vitals  Enc Vitals Group     BP      Pulse      Resp      Temp      Temp src      SpO2      Weight      Height      Head Circumference      Peak Flow      Pain Score      Pain Loc      Pain Edu?      Excl. in [place]?    No data found.  Updated Vital Signs BP 134/90 (BP Location: Right Arm)   Temp 97.9 F (36.6 C) (Oral)   Wt 234 lb (106.1 kg)   SpO2 98%   BMI 32.64 kg/m   Visual Acuity Right Eye Distance:   Left Eye Distance:     Bilateral Distance:    Right Eye Near:   Left Eye Near:    Bilateral Near:     Physical Exam  Constitutional: He is oriented to person, place, and time. He appears well-developed and well-nourished.  HENT:  Head: Normocephalic and atraumatic.  Eyes: EOM are normal.  Neck: Normal range of motion. Neck supple.  No midline bone tenderness, no crepitus or step-offs.   Cardiovascular: Normal rate and regular rhythm.  Pulses:      Radial pulses are 2+ on the right side, and 2+ on the left side.  Pulmonary/Chest: Effort normal and breath sounds normal. No stridor. No respiratory distress. He has no wheezes. He has no rales. He exhibits no tenderness.  Musculoskeletal: Normal range of motion. He exhibits no edema or tenderness.  No spinal tenderness. Full ROM upper and lower extremities. 5/5 strength bilaterally in upper extremities. No localized tenderness to Right or Left arm.   Neurological: He is alert and oriented to person, place, and time. No cranial nerve deficit.  Normal sensation in bilateral upper extremities.  Skin: Skin is warm and dry. Capillary refill takes less than 2 seconds.  Psychiatric: He has a normal mood and affect. His behavior is normal.  Nursing note and vitals reviewed.    UC Treatments / Results  Labs (all labs ordered are listed, but only abnormal results are displayed) Labs Reviewed - No data to display  EKG None  Radiology No results found.  Procedures Procedures (including critical care time)  Medications Ordered in UC Medications - No data to display  Initial Impression / Assessment and Plan / UC Course  I have reviewed the triage vital signs and the nursing notes.  Pertinent labs & imaging results that were available during my care of the patient were reviewed by me and considered in my medical decision making (see chart for details).     Normal exam. Reassured pt, no evidence of ACS at this time. Encouraged to take Tylenol or Motrin as  needed for muscle soreness. F/u with PCP tomorrow as scheduled.  Final Clinical Impressions(s) / UC Diagnoses   Final diagnoses:  Anxiety attack  Numbness and tingling in left arm  Right shoulder strain, initial encounter     Discharge Instructions      Please be sure to follow up with your primary care provider tomorrow for recheck of symptoms and  to discuss anxiety medications.   Call 911 or go to the hospital if symptoms significantly worsen.    ED Prescriptions    None     Controlled Substance Prescriptions [place] Controlled Substance Registry consulted? Not Applicable   Tyrell Antonio 03/27/18 1912

## 2018-03-27 NOTE — Discharge Instructions (Signed)
°  Please be sure to follow up with your primary care provider tomorrow for recheck of symptoms and to discuss anxiety medications.   Call 911 or go to the hospital if symptoms significantly worsen.

## 2022-06-12 ENCOUNTER — Ambulatory Visit (HOSPITAL_COMMUNITY)
Admission: EM | Admit: 2022-06-12 | Discharge: 2022-06-12 | Disposition: A | Discharge status: Home / Self Care | Payer: 59 | Attending: Behavioral Health | Admitting: Behavioral Health

## 2022-06-12 DIAGNOSIS — F411 Generalized anxiety disorder: Secondary | ICD-10-CM | POA: Diagnosis not present

## 2022-06-12 DIAGNOSIS — T50905A Adverse effect of unspecified drugs, medicaments and biological substances, initial encounter: Secondary | ICD-10-CM

## 2022-06-12 MED ORDER — HYDROXYZINE PAMOATE 25 MG PO CAPS: 25.0000 mg | ORAL_CAPSULE | Freq: Three times a day (TID) | ORAL | 0 refills | Status: AC | PRN

## 2022-06-12 NOTE — Progress Notes (Signed)
   06/12/22 1032  [place] (Walk-ins at [facility] only)  How Did You Hear About [place]? Self  What Is the Reason for Your Visit/Call Today? Joseph Calderon is a 47 year old male presenting to [facility] voluntarily with chief complaint of having difficulty sleeping last night. Patient reports "I don't feel normal right now" and reports last night he was having "weird dreams" that were waking him up every two minutes. Pt reports he felt like he was in an "alternate reality" and he was having a hard time getting up but he also could not go to sleep and now patient reports he is afraid something bad will happen to him if he goes to sleep. Patient receives medication management at [facility] and reports he was on Wellbutrin '150mg'$  for 30 days. Patient reports he was doing better after starting the medications but continued to have some anxiety and depression so his medications was increased to '300mg'$  on 06/09/22. Patient reports he did not take his medications last night and fears that this could have caused issues with sleep last night. Patient denies SI, HI, AVH and substance use. Patient is also in the process of receiving therapy services.  How Long Has This Been Causing You Problems? 1 wk - 1 month  Have You Recently Had Any Thoughts About Hurting Yourself? No  Are You Planning to Commit Suicide/Harm Yourself At This time? No  Have you Recently Had Thoughts About [place]? No  Are You Planning To Harm Someone At This Time? No  Are you currently experiencing any auditory, visual or other hallucinations? No  Have You Used Any Alcohol or Drugs in the Past 24 Hours? No  Do you have any current medical co-morbidities that require immediate attention? No  Clinician description of patient physical appearance/behavior: anxious  What Do You Feel Would Help You the Most Today? Treatment for Depression or other mood problem  If access to [facility] was not available, would you have sought care in  the Emergency Department? No  Determination of Need Routine (7 days)  Options For Referral Medication Management;Outpatient Therapy

## 2022-06-12 NOTE — ED Provider Notes (Signed)
Behavioral Health Urgent Care Medical Screening Exam  Patient Name: Joseph Calderon MRN: [id number] Date of Evaluation: 06/12/22 Chief Complaint:   Diagnosis:  Final diagnoses:  Adverse effect of drug, initial encounter  Anxiety state    History of Present illness: Joseph Calderon is a 47 y.o. male patient with a past psychiatric history significant for MDD and GAD who presents to the [facility] behavioral health urgent care voluntary accompanied by his wife [name] with complaints of difficulty sleeping for the past 2 nights.  Patient seen and evaluated face-to-face by this provider, chart reviewed and case discussed with Dr. Lovette Cliche. On evaluation, patient is alert and oriented x 4 His thought process is linear. His speech is clear and coherent at a moderate tone. He denies SI/HI/AVH. There is no objective evidence that the patient is currently responding to internal or external stimuli. His mood is anxious and affect is congruent. He has fair eye contact. He is calm and cooperative. He appears well groomed and is casually dressed. Patient states that for the past 2 nights he has not been able to sleep. He states that last night he experienced "weird dreams" and kept waking up every couple minutes looking at the clock. He describes the weird dreams as "feeling like an alternate reality." He states that he has been under a lot of stress for the past 3 to 4 months. He identifies current stressors as financial, work, Charity fundraiser, and health related stress. He states that his outpatient psychiatrist at the [facility]  increased his Wellbutrin from 150 mg to 300 mg on Thursday. He states that he takes the Wellbutrin 300 mg p.o. at bedtime. He states that on Friday morning he experienced a headache when he woke up. He is unsure if his anxiety has increased since starting the Wellbutrin 300 mg. However, he does appear anxious on exam, pacing excessive worrying and is fixated on thoughts of not  being able to sleep tonight and worried that he may experience more wired dreams tonight. I discussed with the patient that Wellbutrin can cause adverse effects such as headaches, increase in anxiety and trouble sleeping at night. Patient encouraged to take the Wellbutrin 300 mg daily in the morning. He was advised that if his symptoms continues to worsen to contact his outpatient psychiatrist to discuss decreasing the Wellbutrin back to 150 mg. I discussed with the patient the risks and benefits of starting Vistaril 25 mg p.o. 3 times a day as needed for anxiety, during the daytime and nighttime to decrease excessive worrying and to promote sleep. Patient agreeable to starting Vistaril as needed. Patient states that he only takes Wellbutrin. He denies taking other prescribed medications or over-the-counter medications. He denies a significant medical history that would be contraindicated with him taking Vistaril. Patient also encouraged to participate in therapy to address anxiety and current stressors. Patient states that he has a new patient appointment for therapy on January 29 at the [facility].   [place] ED from 06/12/2022 in [place] No Risk       Psychiatric Specialty Exam  Presentation  General Appearance:Appropriate for Environment  Eye Contact:Fair  Speech:Clear and Coherent  Speech Volume:Normal  Handedness:No data recorded  Mood and Affect  Mood: Anxious  Affect: Congruent   Thought Process  Thought Processes: Coherent  Descriptions of Associations:Intact  Orientation:Full (Time, Place and Person)  Thought Content:Rumination; WDL    Hallucinations:None  Ideas of Reference:None  Suicidal  Thoughts:No  Homicidal Thoughts:No   Sensorium  Memory: Immediate Fair; Recent Fair; Remote Fair  Judgment: Fair  Insight: Fair   Materials engineer: Fair  Attention  Span: Fair  Recall: [employer] of Knowledge: Fair  Language: Fair   Psychomotor Activity  Psychomotor Activity: Normal   Assets  Assets: Armed forces logistics/support/administrative officer; Desire for Improvement; Financial Resources/Insurance; Housing; Leisure Time; Intimacy; Physical Health; Social Support; Resilience; Transportation; Talents/Skills; Vocational/Educational   Sleep  Sleep: Poor  Number of hours: No data recorded  No data recorded  Physical Exam: Physical Exam HENT:     Head: Normocephalic.     Nose: Nose normal.  Eyes:     Conjunctiva/sclera: Conjunctivae normal.  Cardiovascular:     Rate and Rhythm: Normal rate.  Pulmonary:     Effort: Pulmonary effort is normal.  Musculoskeletal:        General: Normal range of motion.     Cervical back: Normal range of motion.  Neurological:     Mental Status: He is alert and oriented to person, place, and time.    Review of Systems  Constitutional: Negative.   HENT: Negative.    Eyes: Negative.   Respiratory: Negative.    Cardiovascular: Negative.   Gastrointestinal: Negative.   Genitourinary: Negative.   Musculoskeletal: Negative.   Neurological: Negative.   Endo/Heme/Allergies: Negative.   Psychiatric/Behavioral:  The patient is nervous/anxious and has insomnia.    Blood pressure (!) 158/96, pulse 92, temperature (!) 97.5 F (36.4 C), temperature source Oral, resp. rate 18, SpO2 96 %. There is no height or weight on file to calculate BMI.  Musculoskeletal: Strength & Muscle Tone: within normal limits Gait & Station: normal Patient leans: N/A   [place] MSE Discharge Disposition for Follow up and Recommendations: Based on my evaluation the patient does not appear to have an emergency medical condition and can be discharged with resources and follow up care in outpatient services for Medication Management and Individual Therapy  Discharge recommendations:   Medications: Take the Wellbutrin 300 mg by mouth at bedtime to  reduce trouble sleeping. Take the vistaril 25 mg by mouth three times per day, at least 6 hours in between last dose to anxiety and sleep.  The patient is to contact a medical professional and/or outpatient provider to address any new side effects that develop. The patient should update outpatient providers of any new medications and/or medication changes.   Outpatient Follow up: Please follow up with your outpatient provider at the [facility] if your symptoms worsen.   Therapy: We recommend that patient participate in individual therapy to address mental health concerns.  Safety:   The following safety precautions should be taken:   No sharp objects. This includes scissors, razors, scrapers, and putty knives.   Chemicals should be removed and locked up.   Medications should be removed and locked up.   Weapons should be removed and locked up. This includes firearms, knives and instruments that can be used to cause injury.   The patient should abstain from use of illicit substances/drugs and abuse of any medications.  If symptoms worsen or do not continue to improve or if the patient becomes actively suicidal or homicidal then it is recommended that the patient return to the closest hospital emergency department, the [facility], or call 911 for further evaluation and treatment. National Suicide Prevention Lifeline 1-800-SUICIDE or (204) 701-5772.  About 988 988 offers 24/7 access to trained crisis counselors who can help people  experiencing mental health-related distress. People can call or text 988 or chat 988lifeline.org for themselves or if they are worried about a loved one who may need crisis support.    Marissa Calamity, NP 06/12/2022, 12:24 PM

## 2022-06-12 NOTE — Discharge Instructions (Addendum)
Discharge recommendations:   Medications: Take the Wellbutrin 300 mg by mouth at bedtime to reduce trouble sleeping. Take the vistaril 25 mg by mouth three times per day, at least 6 hours in between last dose to anxiety and sleep.  The patient is to contact a medical professional and/or outpatient provider to address any new side effects that develop. The patient should update outpatient providers of any new medications and/or medication changes.   Outpatient Follow up: Please follow up with your outpatient provider at the [facility] if your symptoms worsen.   Therapy: We recommend that patient participate in individual therapy to address mental health concerns.  Safety:   The following safety precautions should be taken:   No sharp objects. This includes scissors, razors, scrapers, and putty knives.   Chemicals should be removed and locked up.   Medications should be removed and locked up.   Weapons should be removed and locked up. This includes firearms, knives and instruments that can be used to cause injury.   The patient should abstain from use of illicit substances/drugs and abuse of any medications.  If symptoms worsen or do not continue to improve or if the patient becomes actively suicidal or homicidal then it is recommended that the patient return to the closest hospital emergency department, the [facility], or call 911 for further evaluation and treatment. National Suicide Prevention Lifeline 1-800-SUICIDE or (854)530-2277.  About 988 988 offers 24/7 access to trained crisis counselors who can help people experiencing mental health-related distress. People can call or text 988 or chat 988lifeline.org for themselves or if they are worried about a loved one who may need crisis support.
